# Patient Record
Sex: Female | Born: 1975 | Race: White | Hispanic: No | Smoking: Current every day smoker
Health system: Southern US, Academic
[De-identification: ages and names within clinical notes are randomized; demographics above are authoritative.]

## PROBLEM LIST (undated history)

## (undated) DIAGNOSIS — N979 Female infertility, unspecified: Secondary | ICD-10-CM

## (undated) DIAGNOSIS — B1920 Unspecified viral hepatitis C without hepatic coma: Secondary | ICD-10-CM

## (undated) DIAGNOSIS — G43909 Migraine, unspecified, not intractable, without status migrainosus: Secondary | ICD-10-CM

## (undated) DIAGNOSIS — N200 Calculus of kidney: Secondary | ICD-10-CM

## (undated) HISTORY — DX: Calculus of kidney: N20.0

## (undated) HISTORY — PX: HX GALL BLADDER SURGERY/CHOLE: SHX55

## (undated) HISTORY — PX: HX APPENDECTOMY: SHX54

## (undated) HISTORY — PX: HX WISDOM TEETH EXTRACTION: SHX21

---

## 2002-11-26 ENCOUNTER — Emergency Department (HOSPITAL_COMMUNITY): Payer: Self-pay | Admitting: Emergency Medicine

## 2007-12-08 ENCOUNTER — Encounter (FREE_STANDING_LABORATORY_FACILITY): Admit: 2007-12-08 | Discharge: 2007-12-08 | Disposition: A | Discharge status: Home / Self Care | Payer: Self-pay

## 2007-12-08 ENCOUNTER — Encounter (FREE_STANDING_LABORATORY_FACILITY): Admit: 2007-12-08 | Discharge: 2007-12-08 | Disposition: A | Discharge status: Still In Hospital | Payer: Self-pay

## 2007-12-09 LAB — LIPASE: LIPASE: 26 U/L (ref 6–51)

## 2007-12-09 LAB — CBC/DIFF
BASOPHILS: 1 % (ref 0–1)
BASOS ABS: 0.076 THOU/uL (ref 0.0–0.2)
EOS ABS: 0.146 10*3/uL (ref 0.1–0.3)
EOSINOPHIL: 2 % (ref 1–6)
HCT: 35.9 % (ref 33.5–45.2)
HGB: 12.2 g/dL (ref 11.2–15.2)
LYMPHOCYTES: 22 % (ref 20–45)
LYMPHS ABS: 1.74 THOU/uL (ref 1.0–4.8)
MCH: 32 pg (ref 27.4–33.0)
MCHC: 33.9 g/dL (ref 31.6–35.5)
MCV: 94.3 fL (ref 78–100)
MONOCYTES: 5 % (ref 4–13)
MONOS ABS: 0.393 THOU/uL (ref 0.1–0.9)
MPV: 8.8 FL (ref 7.4–10.4)
PLATELET COUNT: 303 THO/UL (ref 140–450)
PMN ABS: 5.59 10*3/uL (ref 1.3–7.7)
PMN'S: 70 % (ref 40–75)
RBC: 3.81 MIL/uL — ABNORMAL LOW (ref 3.84–5.04)
RDW: 11.1 % — ABNORMAL LOW (ref 11.5–14.5)
WBC: 8 10*3/uL (ref 3.5–11.0)

## 2007-12-09 LAB — COMPREHENSIVE METABOLIC PANEL, NON-FASTING
ANION GAP: 7 mmol/L (ref 5–16)
BUN: 8 mg/dL (ref 6–20)
CALCIUM: 9.4 mg/dL (ref 8.5–10.4)
CARBON DIOXIDE: 27 mmol/L (ref 22–32)
CHLORIDE: 103 mmol/L (ref 96–111)
CREATININE: 0.62 mg/dL (ref 0.49–1.10)
ESTIMATED GLOMERULAR FILTRATION RATE: >59 ml/min/1.73m2 (ref >59)
GLUCOSE,NONFAST: 114 mg/dL (ref 65–139)
POTASSIUM: 3.2 mmol/L — ABNORMAL LOW (ref 3.5–5.1)
SODIUM: 137 mmol/L (ref 136–145)

## 2007-12-09 LAB — AMYLASE: AMYLASE: 42 U/L (ref <128)

## 2014-04-14 ENCOUNTER — Encounter (HOSPITAL_COMMUNITY)
Admission: EM | Disposition: A | Discharge status: Home / Self Care | Payer: Self-pay | Source: Home / Self Care | Attending: Surgery

## 2014-04-14 ENCOUNTER — Encounter (HOSPITAL_COMMUNITY): Payer: MEDICAID | Admitting: Anesthesiology

## 2014-04-14 ENCOUNTER — Emergency Department (EMERGENCY_DEPARTMENT_HOSPITAL): Payer: MEDICAID

## 2014-04-14 ENCOUNTER — Other Ambulatory Visit (HOSPITAL_COMMUNITY): Payer: Self-pay | Admitting: Surgery

## 2014-04-14 ENCOUNTER — Emergency Department (HOSPITAL_COMMUNITY): Payer: MEDICAID | Admitting: Radiology

## 2014-04-14 ENCOUNTER — Encounter (HOSPITAL_COMMUNITY): Payer: Self-pay

## 2014-04-14 ENCOUNTER — Inpatient Hospital Stay
Admission: EM | Admit: 2014-04-14 | Discharge: 2014-04-14 | DRG: 342 | Disposition: A | Discharge status: Home / Self Care | Payer: MEDICAID | Attending: Surgery | Admitting: Surgery

## 2014-04-14 ENCOUNTER — Inpatient Hospital Stay (HOSPITAL_BASED_OUTPATIENT_CLINIC_OR_DEPARTMENT_OTHER): Payer: MEDICAID | Admitting: Anesthesiology

## 2014-04-14 ENCOUNTER — Inpatient Hospital Stay (HOSPITAL_COMMUNITY): Payer: MEDICAID | Admitting: Surgery

## 2014-04-14 DIAGNOSIS — A419 Sepsis, unspecified organism: Secondary | ICD-10-CM

## 2014-04-14 DIAGNOSIS — R1031 Right lower quadrant pain: Secondary | ICD-10-CM

## 2014-04-14 DIAGNOSIS — R1110 Vomiting, unspecified: Secondary | ICD-10-CM

## 2014-04-14 DIAGNOSIS — K3589 Other acute appendicitis: Secondary | ICD-10-CM

## 2014-04-14 DIAGNOSIS — F17210 Nicotine dependence, cigarettes, uncomplicated: Secondary | ICD-10-CM | POA: Diagnosis present

## 2014-04-14 DIAGNOSIS — K3580 Unspecified acute appendicitis: Principal | ICD-10-CM

## 2014-04-14 DIAGNOSIS — R6510 Systemic inflammatory response syndrome (SIRS) of non-infectious origin without acute organ dysfunction: Secondary | ICD-10-CM | POA: Diagnosis present

## 2014-04-14 DIAGNOSIS — R109 Unspecified abdominal pain: Secondary | ICD-10-CM

## 2014-04-14 DIAGNOSIS — K37 Unspecified appendicitis: Secondary | ICD-10-CM

## 2014-04-14 HISTORY — DX: Migraine, unspecified, not intractable, without status migrainosus: G43.909

## 2014-04-14 LAB — URINALYSIS MACROSCOPIC WITH REFLEX TO MICROSCOPIC URINALYSIS (CULTURE NOT PERFORMED)
BILIRUBIN: NEGATIVE
COLOR: NORMAL
GLUCOSE: NEGATIVE mg/dL
KETONES: NEGATIVE mg/dL
NITRITE: NEGATIVE
PH URINE: 5 (ref 5.0–8.0)
PROTEIN: NEGATIVE mg/dL
SPECIFIC GRAVITY, URINE: 1.023 (ref 1.005–1.030)
UROBILINOGEN: NEGATIVE mg/dL

## 2014-04-14 LAB — ALT (SGPT): ALT (SGPT): 6 U/L (ref <55)

## 2014-04-14 LAB — TYPE AND SCREEN
ABO/RH(D): B POS
ANTIBODY SCREEN: NEGATIVE

## 2014-04-14 LAB — BASIC METABOLIC PANEL
ANION GAP: 11 mmol/L (ref 4–13)
BUN/CREAT RATIO: 10 (ref 6–22)
BUN: 8 mg/dL (ref 8–25)
CALCIUM: 9.7 mg/dL (ref 8.5–10.4)
CARBON DIOXIDE: 25 mmol/L (ref 22–32)
CHLORIDE: 103 mmol/L (ref 96–111)
CREATININE: 0.77 mg/dL (ref 0.49–1.10)
ESTIMATED GLOMERULAR FILTRATION RATE: >59 mL/min/{1.73_m2} (ref >59)
GLUCOSE,NONFAST: 124 mg/dL (ref 65–139)
POTASSIUM: 3.5 mmol/L (ref 3.5–5.1)
SODIUM: 139 mmol/L (ref 136–145)

## 2014-04-14 LAB — VENOUS BLOOD GAS/LACTATE
%FIO2: 21 %
BASE DEFICIT: 0.2 mmol/L (ref 0.0–2.0)
BICARBONATE: 24 mmol/L (ref 22.0–26.0)
LACTATE: 1.1 mmol/L (ref 0.9–1.7)
PCO2: 44 mm Hg (ref 41.0–51.0)
PH: 7.37 (ref 7.310–7.410)
PO2: 38 mm Hg (ref 35–50)

## 2014-04-14 LAB — CBC/DIFF
BASOPHILS: 1 %
BASOS ABS: 0.105 10*3/uL (ref 0.000–0.200)
EOS ABS: 0.095 10*3/uL (ref 0.000–0.500)
EOSINOPHIL: 1 %
HCT: 37.7 % (ref 33.5–45.2)
HGB: 12.7 g/dL (ref 11.2–15.2)
LYMPHOCYTES: 7 %
LYMPHS ABS: 1.303 10*3/uL (ref 1.000–4.800)
MCH: 31 pg (ref 27.4–33.0)
MCHC: 33.7 g/dL (ref 32.5–35.8)
MCV: 92 fL (ref 78–100)
MONOCYTES: 4 %
MONOS ABS: 0.781 10*3/uL (ref 0.300–1.000)
MPV: 10 fL (ref 7.5–11.5)
PLATELET COUNT: 308 10*3/uL (ref 140–450)
PMN ABS: 17.498 10*3/uL — ABNORMAL HIGH (ref 1.500–7.700)
PMN'S: 87 %
RBC: 4.1 MIL/uL (ref 3.63–4.92)
RDW: 12.1 % (ref 12.0–15.0)
WBC: 19.8 10*3/uL — ABNORMAL HIGH (ref 3.5–11.0)

## 2014-04-14 LAB — URINALYSIS, MICROSCOPIC
RBC'S: 7 /HPF — ABNORMAL HIGH (ref <6)
WBC'S: 2 /HPF (ref <11)

## 2014-04-14 LAB — GAMMA GT: GAMMA GT: 18 U/L (ref 7–50)

## 2014-04-14 LAB — BILIRUBIN, CONJUGATED (DIRECT): BILIRUBIN,CONJUGATED: <0.2 mg/dL (ref <0.3)

## 2014-04-14 LAB — ALK PHOS (ALKALINE PHOSPHATASE): ALKALINE PHOSPHATASE: 89 U/L (ref <150)

## 2014-04-14 LAB — LIPASE: LIPASE: 19 U/L (ref 10–80)

## 2014-04-14 LAB — HCG, SERUM QUALITATIVE, PREGNANCY: PREGNANCY, SERUM QUALITATIVE: NEGATIVE

## 2014-04-14 LAB — AST (SGOT): AST (SGOT): 13 U/L (ref 8–41)

## 2014-04-14 SURGERY — APPENDECTOMY LAPAROSCOPIC
Anesthesia: General | Site: Abdomen | Wound class: Clean Contaminated Wounds-The respiratory, GI, Genital, or urinary

## 2014-04-14 MED ORDER — SUCCINYLCHOLINE CHLORIDE 200 MG/10 ML (20 MG/ML) INTRAVENOUS SYRINGE
INJECTION | Freq: Once | INTRAVENOUS | Status: DC | PRN
Start: 2014-04-14 — End: 2014-04-14
  Administered 2014-04-14: 100 mg via INTRAVENOUS

## 2014-04-14 MED ORDER — DEXAMETHASONE SODIUM PHOSPHATE 4 MG/ML INJECTION SOLUTION
Freq: Once | INTRAMUSCULAR | Status: DC | PRN
Start: 2014-04-14 — End: 2014-04-14
  Administered 2014-04-14: 4 mg via INTRAVENOUS

## 2014-04-14 MED ORDER — PROPOFOL 10 MG/ML IV BOLUS
INJECTION | Freq: Once | INTRAVENOUS | Status: DC | PRN
Start: 2014-04-14 — End: 2014-04-14
  Administered 2014-04-14: 200 mg via INTRAVENOUS
  Administered 2014-04-14: 5 mg via INTRAVENOUS

## 2014-04-14 MED ORDER — OXYCODONE-ACETAMINOPHEN 5 MG-325 MG TABLET
1.00 | ORAL_TABLET | ORAL | Status: DC | PRN
Start: 2014-04-14 — End: 2014-05-02

## 2014-04-14 MED ORDER — PHENYLEPHRINE 0.5 MG/5 ML (100 MCG/ML)IN 0.9 % SOD.CHLORIDE IV SYRINGE
INJECTION | Freq: Once | INTRAVENOUS | Status: DC | PRN
Start: 2014-04-14 — End: 2014-04-14
  Administered 2014-04-14: 150 ug via INTRAVENOUS

## 2014-04-14 MED ORDER — IOPAMIDOL 300 MG IODINE/ML (61 %) INTRAVENOUS SOLUTION
100.00 mL | INTRAVENOUS | Status: AC
Start: 2014-04-14 — End: 2014-04-14
  Administered 2014-04-14: 100 mL via INTRAVENOUS

## 2014-04-14 MED ORDER — SODIUM CHLORIDE 0.9 % IV BOLUS
1000.00 mL | INJECTION | Status: AC
Start: 2014-04-14 — End: 2014-04-14
  Administered 2014-04-14: 1000 mL via INTRAVENOUS
  Administered 2014-04-14: 0 mL via INTRAVENOUS

## 2014-04-14 MED ORDER — ROCURONIUM 50 MG/5 ML (10 MG/ML) INTRAVENOUS SYRINGE
INJECTION | Freq: Once | INTRAVENOUS | Status: DC | PRN
Start: 2014-04-14 — End: 2014-04-14
  Administered 2014-04-14: 35 mg via INTRAVENOUS

## 2014-04-14 MED ORDER — LACTATED RINGERS INTRAVENOUS SOLUTION
INTRAVENOUS | Status: DC
Start: 2014-04-14 — End: 2014-04-14

## 2014-04-14 MED ORDER — NEOSTIGMINE METHYLSULFATE 1 MG/ML INTRAVENOUS SOLUTION
Freq: Once | INTRAVENOUS | Status: DC | PRN
Start: 2014-04-14 — End: 2014-04-14
  Administered 2014-04-14: 3 mg via INTRAVENOUS

## 2014-04-14 MED ORDER — ENOXAPARIN 40 MG/0.4 ML SUBCUTANEOUS SYRINGE
40.0000 mg | INJECTION | SUBCUTANEOUS | Status: DC
Start: 2014-04-15 — End: 2014-04-14

## 2014-04-14 MED ORDER — FENTANYL (PF) 50 MCG/ML INJECTION SOLUTION
INTRAMUSCULAR | Status: AC
Start: 2014-04-14 — End: 2014-04-14
  Administered 2014-04-14: 100 ug via INTRAVENOUS
  Filled 2014-04-14: qty 2

## 2014-04-14 MED ORDER — FENTANYL (PF) 50 MCG/ML INJECTION SOLUTION
Freq: Once | INTRAMUSCULAR | Status: DC | PRN
Start: 2014-04-14 — End: 2014-04-14
  Administered 2014-04-14 (×2): 50 ug via INTRAVENOUS

## 2014-04-14 MED ORDER — GLYCOPYRROLATE 0.2 MG/ML INJECTION SOLUTION
Freq: Once | INTRAMUSCULAR | Status: DC | PRN
Start: 2014-04-14 — End: 2014-04-14
  Administered 2014-04-14: 0.2 mg via INTRAVENOUS

## 2014-04-14 MED ORDER — PROMETHAZINE 25 MG/ML INJECTION SOLUTION
25.00 mg | INTRAMUSCULAR | Status: AC
Start: 2014-04-14 — End: 2014-04-14

## 2014-04-14 MED ORDER — SODIUM CHLORIDE 0.9 % INTRAVENOUS SOLUTION
12.50 mg | Freq: Once | INTRAVENOUS | Status: DC | PRN
Start: 2014-04-14 — End: 2014-04-14
  Administered 2014-04-14: 12.5 mg via INTRAVENOUS
  Filled 2014-04-14: qty 0.5

## 2014-04-14 MED ORDER — ONDANSETRON HCL (PF) 4 MG/2 ML INJECTION SOLUTION
4.00 mg | Freq: Once | INTRAMUSCULAR | Status: DC | PRN
Start: 2014-04-14 — End: 2014-04-14

## 2014-04-14 MED ORDER — ONDANSETRON HCL (PF) 4 MG/2 ML INJECTION SOLUTION
4.0000 mg | Freq: Four times a day (QID) | INTRAMUSCULAR | Status: DC | PRN
Start: 2014-04-14 — End: 2014-04-14

## 2014-04-14 MED ORDER — OXYCODONE-ACETAMINOPHEN 5 MG-325 MG TABLET
2.0000 | ORAL_TABLET | ORAL | Status: DC | PRN
Start: 2014-04-14 — End: 2014-04-14
  Administered 2014-04-14: 2 via ORAL
  Filled 2014-04-14: qty 2

## 2014-04-14 MED ORDER — PROMETHAZINE 25 MG/ML INJECTION SOLUTION
INTRAMUSCULAR | Status: AC
Start: 2014-04-14 — End: 2014-04-14
  Administered 2014-04-14: 25 mg via INTRAMUSCULAR
  Filled 2014-04-14: qty 1

## 2014-04-14 MED ORDER — MORPHINE 4 MG/ML INTRAVENOUS CARTRIDGE
4.0000 mg | CARTRIDGE | INTRAVENOUS | Status: DC | PRN
Start: 2014-04-14 — End: 2014-04-14

## 2014-04-14 MED ORDER — MIDAZOLAM 1 MG/ML INJECTION SOLUTION
Freq: Once | INTRAMUSCULAR | Status: DC | PRN
Start: 2014-04-14 — End: 2014-04-14
  Administered 2014-04-14: 2 mg via INTRAVENOUS

## 2014-04-14 MED ORDER — CEFOXITIN 1 GRAM INTRAVENOUS SOLUTION
2.00 g | INTRAVENOUS | Status: AC
Start: 2014-04-14 — End: 2014-04-14
  Administered 2014-04-14: 0 g via INTRAVENOUS
  Administered 2014-04-14: 2 g via INTRAVENOUS
  Filled 2014-04-14: qty 20

## 2014-04-14 MED ORDER — OXYCODONE-ACETAMINOPHEN 5 MG-325 MG TABLET
1.0000 | ORAL_TABLET | ORAL | Status: DC | PRN
Start: 2014-04-14 — End: 2014-04-14

## 2014-04-14 MED ORDER — FENTANYL (PF) 50 MCG/ML INJECTION SOLUTION
100.00 ug | INTRAMUSCULAR | Status: AC
Start: 2014-04-14 — End: 2014-04-14

## 2014-04-14 MED ORDER — HYDROMORPHONE 1 MG/ML INJECTION WRAPPER
0.40 mg | INJECTION | INTRAMUSCULAR | Status: DC | PRN
Start: 2014-04-14 — End: 2014-04-14
  Administered 2014-04-14 (×4): 0.4 mg via INTRAVENOUS
  Filled 2014-04-14 (×2): qty 1

## 2014-04-14 MED ORDER — HYDROMORPHONE 1 MG/ML INJECTION WRAPPER
0.20 mg | INJECTION | INTRAMUSCULAR | Status: DC | PRN
Start: 2014-04-14 — End: 2014-04-14

## 2014-04-14 MED ORDER — LIDOCAINE (PF) 100 MG/5 ML (2 %) INTRAVENOUS SYRINGE
INJECTION | Freq: Once | INTRAVENOUS | Status: DC | PRN
Start: 2014-04-14 — End: 2014-04-14
  Administered 2014-04-14: 50 mg via INTRAVENOUS

## 2014-04-14 MED ORDER — ONDANSETRON HCL (PF) 4 MG/2 ML INJECTION SOLUTION
4.00 mg | INTRAMUSCULAR | Status: AC
Start: 2014-04-14 — End: 2014-04-14

## 2014-04-14 MED ORDER — ONDANSETRON HCL (PF) 4 MG/2 ML INJECTION SOLUTION
Freq: Once | INTRAMUSCULAR | Status: DC | PRN
Start: 2014-04-14 — End: 2014-04-14
  Administered 2014-04-14: 4 mg via INTRAVENOUS

## 2014-04-14 MED ORDER — HYDROMORPHONE 1 MG/ML INJECTION WRAPPER
0.2000 mg | INJECTION | INTRAMUSCULAR | Status: DC | PRN
Start: 2014-04-14 — End: 2014-04-14
  Administered 2014-04-14 (×2): 0.5 mg via INTRAVENOUS

## 2014-04-14 MED ORDER — ONDANSETRON HCL (PF) 4 MG/2 ML INJECTION SOLUTION
INTRAMUSCULAR | Status: AC
Start: 2014-04-14 — End: 2014-04-14
  Administered 2014-04-14: 4 mg via INTRAVENOUS
  Filled 2014-04-14: qty 2

## 2014-04-14 MED ADMIN — linezolid in 5% dextrose in water 600 mg/300 mL intravenous piggyback: INTRAVENOUS | @ 12:00:00

## 2014-04-14 MED ADMIN — DEXAMETHASONE IVPB: INTRAVENOUS | @ 12:00:00

## 2014-04-14 MED ADMIN — lactated Ringers intravenous solution: INTRAVENOUS | @ 13:00:00 | NDC 00338011704

## 2014-04-14 MED ADMIN — Medication: INTRAVENOUS | @ 09:00:00

## 2014-04-14 SURGICAL SUPPLY — 60 items
APPL 70% ISPRP 2% CHG 26ML 13._2X13.2IN CHLRPRP PREP DEHP-FR (WOUND CARE/ENTEROSTOMAL SUPPLY) ×1
APPL 70% ISPRP 2% CHG 26ML CHLRPRP HI-LT ORNG PREP STRL LF  DISP CLR (WOUND CARE SUPPLY) ×1 IMPLANT
APPLIER E-CLP III SUP INTLK 33CM 5MM PSTL GRIP GLARE RST SAF INTLK HNDL 16 CLIP TI MED LRG INTERNAL (ENDOSCOPIC SUPPLIES) IMPLANT
APPLIER E-CLP III SUP INTLK 33_CM 5MM PSTL GRIP GLARE RST SAF (INSTRUMENTS ENDOMECHANICAL)
APPLIER E-CLP2 SUP INTLK 29CM 10MM PSTL GRIP GLARE RST SAF INTLK HNDL 20 ML CLIP TI INTERNAL CLIP (ENDOSCOPIC SUPPLIES) IMPLANT
APPLIER E-CLP2 SUP INTLK 29CM_10MM PSTL GRIP GLARE RST SAF (INSTRUMENTS ENDOMECHANICAL)
BANDAGE CURITY 3.75X2IN 1 7/8I NX1.25IN FLXB STRCH FBRC XL (WOUND CARE SUPPLY) ×1 IMPLANT
BANDAGE CURITY 3.75X2IN 1 7/8I NX1.25IN FLXB STRCH FBRC XL (WOUND CARE/ENTEROSTOMAL SUPPLY) ×1
BANDAGE CURITY 3X.75IN .75X7/8 IN FLXB STRCH FBRC RECT ADH TN (WOUND CARE SUPPLY) ×3 IMPLANT
BANDAGE CURITY 3X.75IN .75X7/8 IN FLXB STRCH FBRC RECT ADH TN (WOUND CARE/ENTEROSTOMAL SUPPLY) ×3
BANDAGE PATCH PLASTIC CURAD_11/2 X 11/2IN 00330 (WOUND CARE/ENTEROSTOMAL SUPPLY) ×3
BLANKET 3M BAIR HUG ADLT UPR B ODY 74X24IN PLMR 2 INCS ADH (MISCELLANEOUS PT CARE ITEMS) ×2 IMPLANT
CONV USE 338641 - PACK SURG LAPSCP NONST DISP LF (CUSTOM TRAYS & PACK) ×1 IMPLANT
CONV USE 402176 - GOWN SURG LRG L3 NONREINFORCE HKLP CLSR SET IN SLEEVE STRL LF  DISP BLU SIRUS SMS 43IN (DGOW) ×1
CONV USE ITEM 162110 - BANDAGE PATCH PLASTIC CURAD_11/2 X 11/2IN 00330 (WOUND CARE SUPPLY) ×3 IMPLANT
CONV USE ITEM 324829 - SLEEVE LAPSCP 12MM 100MM EPTH XCL UNIV STAB CANN RADOPQ TROCAR STRL LF  DISP (ENDOSCOPIC SUPPLIES) IMPLANT
COVER WAND RFD STRL 50EA/CS_01-0020 (EQUIPMENT MINOR)
COVER WND RF DETECT STRL CLR EQP (EQUIPMENT MINOR) IMPLANT
DEVICE ENDOS ENDO PNUT 5MM 45CM SWAB COTTON DISP (ENDOSCOPIC SUPPLIES) ×1
DEVICE ENDOS ENDO PNUT 5MM 45C_M SWAB COTTON DISP (INSTRUMENTS ENDOMECHANICAL) ×1
DISC USE 30200_ENDO GIA ROTIC 45 2.5 DLU_BX/6 030454 (ENDOSCOPIC SUPPLIES) ×1 IMPLANT
DISC USE 30200_ENDO GIA ROTIC 45 2.5 DLU_BX/6 030454 (INSTRUMENTS ENDOMECHANICAL) ×1
DONUT EXTREMITY CUSHIONING 31143137 (POSITIONING PRODUCTS) ×2 IMPLANT
DUPE USE ITEM 49603 - TROCAR BLUNTPORT W/FOAM GRIP_176626P 3EA/BX (ENDOSCOPIC SUPPLIES) ×1 IMPLANT
ENDO GIA RELOAD ROTIC 30X2.5 030451 6/BX (ENDOSCOPIC SUPPLIES) ×2 IMPLANT
ENDO GIA RELOAD ROTIC 30X2.5 030451 6/BX (INSTRUMENTS ENDOMECHANICAL) ×2
ENDO GIA ROTIC 45 3.5 DLU BX/6 030455 (ENDOSCOPIC SUPPLIES) ×1 IMPLANT
ENDO GIA ROTIC 45 3.5 DLU BX/6 030455 (INSTRUMENTS ENDOMECHANICAL) ×1
ENDO RELOAD ROTIC 30 X 3.5 030452 (ENDOSCOPIC SUPPLIES) ×1 IMPLANT
ENDO RELOAD ROTIC 30 X 3.5 030452 (INSTRUMENTS ENDOMECHANICAL) ×1
ENDOCATCH 10MM SPEC POUCH_173050G 6EA/BX (INSTRUMENTS ENDOMECHANICAL) ×1
GARMENT COMPRESS MED CALF CENTAURA NYL VASOGRAD LTWT BRTHBL SEQ FIL BLU 18- IN (ORTHOPEDICS (NOT IMPLANTS)) ×1 IMPLANT
GARMENT COMPRESS MED CALF CENT_AURA NYL VASOGRAD LTWT BRTHBL (ORTHOPEDICS (NOT IMPLANTS)) ×1
GOWN SURG LRG AAMI L3 NONREINF_ORCE SET IN SLEEVE HKLP CLSR (DGOW) ×1
GOWN SURG LRG L3 NONREINFORCE HKLP CLSR SET IN SLEEVE STRL LF  DISP BLU SIRUS SMS 43IN (DGOW) ×1 IMPLANT
IRR SUCT STRKFL2 TIP STRL LF  DISP (IRR) IMPLANT
IRR SUCT STRKFL2 TIP STRL LF_DISP (IRR)
KIT RM TURNOVER CLEANOP CSTM INFCT CONTROL (KITS & TRAYS (DISPOSABLE)) ×1
KIT RM TURNOVER CLEANOP CSTM I_NFCT CONTROL (KITS & TRAYS (DISPOSABLE)) ×1
KIT RM TURNOVER CLEANOP CUSTOM INFCT CONTROL (KITS & TRAYS (DISPOSABLE)) ×1 IMPLANT
MBO USE ITEM 130230 - DEVICE ENDOS ENDO PNUT 5MM 45CM SWAB COTTON DISP (ENDOSCOPIC SUPPLIES) ×1 IMPLANT
PACK LAPAROSCOPIC CUSTOM (CUSTOM TRAYS & PACK) ×1
PACK SURG LAPSCP NONST DISP LF (CUSTOM TRAYS & PACK) ×1
PAD ARMBOARD FOAM BLU_FP-ECARM (POSITIONING PRODUCTS) ×4
PAD ARMBRD BLU (POSITIONING PRODUCTS) ×2 IMPLANT
POUCH SPEC RETR 34.5CM 10MM E-CTCH GLD POLYUR ERG HNDL LONG CYL TUBE 29.5CM LAPSCP LF  DISP (ENDOSCOPIC SUPPLIES) ×1 IMPLANT
SLEEVE TROCAR 12-100MM_OPTIVIEW CB12LT 6EA/BX (INSTRUMENTS ENDOMECHANICAL)
SOL IV 0.9% NACL 500ML PLASTIC CONTAINR VIAFLEX LF (SOLUTIONS) IMPLANT
SOLUTION IV NS INJ 500CC_2B1323Q 24/CS (SOLUTIONS)
STAPLER ENDO GIA UNIV 12MM_EGIAUSTND 3EA/BX (INSTRUMENTS ENDOMECHANICAL)
STAPLER ENDO GIA UNIV XL_ULTRA BX/3 EGIAUXL (INSTRUMENTS ENDOMECHANICAL) ×1
STAPLER INTERNAL 16CMX4MM PVC STD UNIV TISS STRL LF  DISP EGIA ENDOS 12MM (ENDOSCOPIC SUPPLIES) IMPLANT
STAPLER INTERNAL 26CMX4MM TI XL UNIV X THKTIS ARTC LRG BCKT STRL LF  DISP EGIA ULTRA ENDOS 12MM (ENDOSCOPIC SUPPLIES) ×1 IMPLANT
SUTURE 0 CT1 PDS2 27IN VIOL MONOF ABS (SUTURE/WOUND CLOSURE) ×2 IMPLANT
SUTURE 0 CT1 PDS2 27IN VIOL MO_NOF ABS (SUTURE/WOUND CLOSURE) ×2
SUTURE 4-0 PS2 MONOCRYL MTPS 27IN UNDYED MONOF ABS (SUTURE/WOUND CLOSURE) ×2 IMPLANT
SUTURE 4-0 PS2 MONOCRYL MTPS 2_7IN UNDYED MONOF ABS (SUTURE/WOUND CLOSURE) ×2
TROCAR BLADELESS 5MM ONB5STF_ONB5STF 6EA/BX (TROC) ×2
TROCAR BLUNTPORT W/FOAM GRIP_176626P 3EA/BX (INSTRUMENTS ENDOMECHANICAL) ×1
TROCAR LAPSCP STD 100MM 5MM VERSAONE FIX CANN BLDLS DLPHN NOSE TIP OPTC STRL LF  DISP (TROC) ×2 IMPLANT

## 2014-04-14 NOTE — Anesthesia Preprocedure Evaluation (Signed)
Physical Exam:     Airway       Mallampati: II    TM distance: >3 FB    Mouth Opening: fair.  No Facial hair          Dental       Dentition intact             Pulmonary    Breath sounds clear to auscultation  (-) no rhonchi, no decreased breath sounds, no wheezes, no rales and no stridor     Cardiovascular    Rhythm: regular  Rate: Normal  (-) no friction rub, carotid bruit is not present, no peripheral edema and no murmur     Other findings            Anesthesia Plan:  Planned anesthesia type: general  ASA 2     Intravenous induction   Patient's NPO status is appropriate for Anesthesia.    Anesthetic plan and risks discussed with patient.        Use of blood products discussed with patient whom.     Plan discussed with CRNA.                  NPO today. Vomited earlier, no nausea now

## 2014-04-14 NOTE — ED Provider Notes (Signed)
Chief Complaint: abdominal pain      History of Present Illness  History provided by patient.     Chelsea GaleaJamie Marie Mccall is a 38 y.o. female reporting to the ED via POV presenting with a CC of abdominal pain and vomiting. Reports she started having peri-umbilical pain around midnight and then awoke a short time later with NBNB vomiting. States pain is now migrating to RLQ. Described as constant, sharp, no alleviating or aggravating factors, radiating from periumbilical region to RLQ. Has h/o laparoscopic cholecystectomy. No fevers, chills, diarrhea. Has associated nausea and vomiting. No urinary or vaginal complaints.       Review of Systems  Constitutional: No fever, chills or weakness.   Skin: No rash or diaphoresis.   HENT: No headaches or congestion.   Eyes: No vision changes.   Cardio: No chest pain, palpitations or leg swelling.   Respiratory: No cough, wheezing or SOB.   GI: + nausea, + vomiting, + abdominal pain.   GU: No urinary changes.   MSK: No joint or back pain.   Neuro: No seizures or LOC.   Psychiatric: No depression, SI or substance abuse.   All other systems reviewed and are negative or as noted in HPI.      History  Past Medical History:   Past Medical History   Diagnosis Date    Migraine          Current Medications:   Previous Medications    No medications on file     Past Surgical History:   Past Surgical History   Procedure Laterality Date    Hx gall bladder surgery/chole         Social History:   History     Social History    Marital Status: N/A     Spouse Name: N/A     Number of Children: N/A    Years of Education: N/A     Occupational History    Not on file.     Social History Main Topics    Smoking status: Current Every Day Smoker -- 1.00 packs/day     Types: Cigarettes    Smokeless tobacco: Not on file    Alcohol Use: No    Drug Use: No    Sexual Activity: Not on file     Other Topics Concern    Not on file     Social History Narrative    No narrative on file     Family History: No  family history on file.  Allergies:   Allergies   Allergen Reactions    Naproxen        Above history reviewed with patient. Changes are as documented.      Physical Exam      Filed Vitals:    04/14/14 0447   BP: 100/62   Pulse: 98   Temp: 36.6 C (97.9 F)   Resp: 20   SpO2: 99%     Constitutional: NAD. Oriented. Actively vomiting.   Head: Normocephalic and atraumatic.   Mouth/Throat: Oropharynx is clear and dry  Eyes: Conjugate gaze. PERRL. EOMI  Neck: Trachea midline. Neck supple.   Cardiovascular: RRR. No murmurs, rubs or gallops. Intact distal pulses.   Pulmonary/Chest: BS equal bilaterally. No respiratory distress. No wheezes, rales, or rhonchi. No chest wall tenderness.   Abdominal: BS+. Abdomen soft and non-distended. RLQ TTP with guarding. No rebound. No CVAT  Musculoskeletal: No edema, tenderness or deformity.   Skin: Warm and dry. No rash, erythema,  pallor or cyanosis.   Psychiatric: Behavior is normal.   Neurological: Alert and oriented x3.    Nursing notes reviewed.      Course and Medical Decision Making  Impression: Patient presenting with a chief complaint of abdominal pain, vomiting concerning for, but not limited to, appendicitis vs SBO vs Ureteral stone.    Plan: Will obtain the following labs/imaging and administer the following medications to alleviate symptoms:       All labs were reviewed. Medical Records reviewed.       Consultations/Updates  -Pt given pain and nausea control with some relief  -CT concerning for non-perforated appendicitis  -Surgery consulted.   -Given Cefoxitin per recs of Surgery team  -Pt care transferred to Dr. Sherrie MustacheFisher at the end of my shift. Awaiting inpatient transfer      Disposition  Admit    Evonnie DawesHaley Elizabeth Moore Rapp, MD 04/14/2014, 04:56  PGY 3  [place] [facility]

## 2014-04-14 NOTE — Progress Notes (Addendum)
[facility]  Daily Surgery Progress Note     Chelsea, Mccall, 38 y.o. female  Date of Admission:  04/14/2014  Date of service: 04/14/2014  Date of Birth:  [birth year]    Subjective: POD0 s/p lap appy. Pain not well controlled. Some nausea and anxiety.    Vital Signs:  Temp (24hrs) Max:37.1 C (84.6 F)      Systolic (96EXB), MWU:13 mmHg, Min:86 mmHg, KGM:010 mmHg    Diastolic (27OZD), GUY:40 mmHg, Min:42 mmHg, Max:73 mmHg    Temp  Avg: 36.7 C (98 F)  Min: 36.4 C (97.5 F)  Max: 37.1 C (98.8 F)  Pulse  Avg: 63.2  Min: 49  Max: 98  Resp  Avg: 17.1  Min: 12  Max: 23  SpO2  Avg: 99.9 %  Min: 99 %  Max: 100 %  MAP (Non-Invasive)  Avg: 67.1 mmHG  Min: 53 mmHG  Max: 77 mmHG  Pain Score (Numeric, Faces): 6    Today's Physical Exam:  Gen: mild distress  Chest: CTAB  CV: RRR  Abd: soft, non-distended, dressings c/d/i, appropriately TTP  Extremities: FROM, 2+ peripheral pulses  Neuro: aaox3      Labs:  Results for orders placed or performed during the hospital encounter of 04/14/14 (from the past 24 hour(s))   CBC/DIFF   Result Value Ref Range    WBC 19.8 (H) 3.5 - 11.0 THOU/uL    RBC 4.10 3.63 - 4.92 MIL/uL    HGB 12.7 11.2 - 15.2 g/dL    HCT 37.7 33.5 - 45.2 %    MCV 92.0 78 - 100 fL    MCH 31.0 27.4 - 33.0 pg    MCHC 33.7 32.5 - 35.8 g/dL    RDW 12.1 12.0 - 15.0 %    PLATELET COUNT 308 140 - 450 THOU/uL    MPV 10.0 7.5 - 11.5 fL    PMN'S 87 %    PMN ABS 17.498 (H) 1.500 - 7.700 THOU/uL    LYMPHOCYTES 7 %    LYMPHS ABS 1.303 1.000 - 4.800 THOU/uL    MONOCYTES 4 %    MONOS ABS 0.781 0.300 - 1.000 THOU/uL    EOSINOPHIL 1 %    EOS ABS 0.095 0.000 - 0.500 THOU/uL    BASOPHILS 1 %    BASOS ABS 0.105 0.000 - 0.200 THOU/uL   BASIC METABOLIC PANEL, NON-FASTING   Result Value Ref Range    SODIUM 139 136 - 145 mmol/L    POTASSIUM 3.5 3.5 - 5.1 mmol/L    CHLORIDE 103 96 - 111 mmol/L    CARBON DIOXIDE 25 22 - 32 mmol/L    ANION GAP 11 4 - 13 mmol/L    CREATININE 0.77 0.49 - 1.10 mg/dL    ESTIMATED GLOMERULAR  FILTRATION RATE >59 >59 ml/min/1.39m    GLUCOSE,NONFAST 124 65 - 139 mg/dL    BUN 8 8 - 25 mg/dL    BUN/CREAT RATIO 10 6 - 22    CALCIUM 9.7 8.5 - 10.4 mg/dL   ALK PHOS (ALKALINE PHOSPHATASE)   Result Value Ref Range    ALKALINE PHOSPHATASE 89 <150 U/L   ALT (SGPT)   Result Value Ref Range    ALT (SGPT) 6 <55 U/L   AST (SGOT)   Result Value Ref Range    AST (SGOT) 13 8 - 41 U/L   BILIRUBIN, CONJUGATED (DIRECT)   Result Value Ref Range    BILIRUBIN,CONJUGATED <0.2 <0.3 mg/dL   GAMMA GT  Result Value Ref Range    GAMMA GT 18 7 - 50 U/L   LIPASE   Result Value Ref Range    LIPASE 19 10 - 80 U/L   URINALYSIS WITH CULTURE REFLEX   Result Value Ref Range    APPEARANCE CLOUDY (A) CLEAR    COLOR NORMAL NORMAL    SPECIFIC GRAVITY, URINE 1.023 1.005 - 1.030    GLUCOSE NEGATIVE NEGATIVE mg/dL    BILIRUBIN NEGATIVE NEGATIVE    KETONES NEGATIVE NEGATIVE mg/dL    BLOOD MODERATE (A) NEGATIVE    PH URINE 5.0 5.0 - 8.0    PROTEIN NEGATIVE NEGATIVE mg/dL    UROBILINOGEN NEGATIVE NEGATIVE mg/dL    NITRITE NEGATIVE NEGATIVE    LEUKOCYTES SMALL (A) NEGATIVE   URINALYSIS, MICROSCOPIC   Result Value Ref Range    RBC'S 7 (H) <6 /HPF    WBC'S 2 <11 /HPF    BACTERIA OCCASIONAL OR LESS OCL^OCCASIONAL OR LESS /hpf    SQUAMOUS EPITHELIAL FEW (A) OCCASIONAL OR LESS /LPF    MUCOUS LIGHT LIGHT /LPF   HCG, SERUM QUALITATIVE, PREGNANCY   Result Value Ref Range    PREGNANCY, SERUM QUALITATIVE NEGATIVE NEGATIVE   VENOUS BLOOD GAS/LACTATE   Result Value Ref Range    %FIO2 21 %    PH 7.370 7.310 - 7.410    PCO2 44.0 41.0 - 51.0 mm Hg    PO2 38 35 - 50 mm Hg    BICARBONATE 24.0 22.0 - 26.0 mmol/L    BASE EXCESS Test Not Performed 0.0 - 2.0 mmol/L    BASE DEFICIT 0.2 0.0 - 2.0 mmol/L    Specimen Type VENOUS     LACTATE 1.1 0.9 - 1.7 mmol/L   TYPE AND SCREEN   Result Value Ref Range    UNITS ORDERED NOT STATED         ABO/RH(D) B POSITIVE     ANTIBODY SCREEN NEGATIVE     SPECIMEN EXPIRATION DATE 04/17/2014          Assessment/ Plan:  Chelsea Mccall is  a 38 y.o., White female who presented on 04/14/2014 with Appendicitis [K37] who is now POD0 s/p lap appy.  Active Hospital Problems    Diagnosis    Appendicitis       Regular diet  PO percocet for pain, IV morphine for breakthrough  AAT    Dispo: possible discharge later today if pain is under control    Melynda Ripple II, MD  04/14/2014, 15:28      I saw and examined the patient.  I reviewed the resident's note.  I agree with the findings and plan of care as documented in the resident's note.  Any exceptions/additions are edited/noted.    Dorena Bodo, MD, FACS  Associate Professor of [place]    Janora Norlander, MD 04/15/2014, 07:52

## 2014-04-14 NOTE — Nurses Notes (Signed)
Patient discharged to home via ambulation with husband.  Discharge instructions reviewed with patient verbally and in writing.  Follow up x2 weeks.  May shower, no tubs, baths until 3 weeks.  No driving while taking pain meds.  Script given to patient.  PIV removed, catheter intact.  No further needs.

## 2014-04-14 NOTE — OR Surgeon (Addendum)
[facility]  DEPARTMENT OF SURGERY  OPERATION SUMMARY    PATIENT NAME: Chelsea Mccall  HOSPITAL ZOXWRU:[id number]:[id number]  DATE OF SERVICE: 04/14/2014  DATE OF BIRTH: 06/17/1976    PREOPERATIVE DIAGNOSIS:  Acute appendicitis.    POSTOPERATIVE DIAGNOSIS:  Acute appendicitis.    NAME OF PROCEDURE:  Laparoscopic appendectomy.    SURGEONS:  Gala Lewandowskyavid Borgstromt, MD (attending), Mellody LifeNathan Airhart MD (resident).    INDICATIONS FOR PROCEDURE:    Chelsea Mccall is a 38 y.o. female who presented to the ED with periumbilical / RLQ pain associated with emesis and anorexia. A CT of the abdomen demonstrated evidence of a dilated appendix. She was therefore taken to the OR for a laparoscopic appendectomy.     ANESTHESIA TYPE: General endotracheal anesthesia    ESTIMATED BLOOD LOSS Minimal    COMPLICATIONS: None    WOUND CLASS: Clean Wound: Uninfected operative wounds in which no inflammation occurred    DESCRIPTION OF PROCEDURE:  The patient was brought into the operating room after obtaining informed consent and general anesthesia was induced. The abdomen was prepped and draped in a sterile fashion and an infraumbilical incision was made with an 11 blade and carried down to the base of the umbilicus. The base was grasped and retracted cephalad, an incision was made in the midline fascia. Stay sutures of 0-zPDS were placed on each side and blunt entry was made into the peritoneal cavity without incident. A Hasson trocar was introduced and CO2 was insufflated, creating a pneumoperitoneum to 15 mmHg. There was no evidence of any complication was seen as a result of gaining access to the patient's abdomen.     Attention was then turned to the patient's right lower quadrant where the appendix was identified as it was attached to the cecum.  A 5-mm trocar was placed in the left lower quadrant , followed by a 5mm trocar in the midline suprapubic region.  Once this was accomplished, the blunt dissectors were used to retract  the appendix and a window was made through the mesoappendix at the base of the appendix with a [place] grasper. Mild inflammation was present but no abscess or evidence of necrosis/perforation was visualized.  Once this window was obtained, a GIA stapler was used with a  blue load to transect the base of the appendix from its attachment to the cecum.  The appendix was then retracted superiorly and a GIA white load was used to transect the mesoappendix.  Both staple lines were inspected.  They were found to be hemostatic.  The GIA stapler was removed and an Endo Catch was inserted to collect the appendix.   The appendix was removed through the infraumbilical incision and the remaining 5mm trocars were removed.  The insufflation was released from the patient's abdomen.  The LLQ fascia was closed using the 0 PDS suture.  The fascial defect was closed by palpable inspection.  A 4-0 Monocryl was then used in a subcuticular fashion to close the patient's skin both at the periumbilical incision as well as the trocar sites.  Band Aids were applied.  At the end of the case, the instrument, scrub and needle counts were correct x2.     SPECIMENS: vermiform appendix    Mellody LifeNathan Airhart, MD  04/14/2014, 12:51      I was present for all key and/or critical portions of the case and immediately available at all times.    Vernie Ammonsavid  C Auron Tadros, MD, FACS  Associate Professor of Surgery  General Surgery/ Trauma/ Surgical Critical Care        Bethann Humbleavid Neshia Mckenzie, MD 04/19/2014, 15:09

## 2014-04-14 NOTE — ED Nurses Note (Signed)
Pt resting in bed at this time, no c/o noted, will continue to monitor.

## 2014-04-14 NOTE — ED Attending Handoff Note (Signed)
Note begun by: Pete Glatter, MD on 04/14/2014 at 7:07 AM  Sign-out received from Dr. Waylan Rocher  Pertinent history and physical:  72 F abd pain, nausea, vomiting,   Diagnostic Information:     -  CT:  Abnormally dilated appendix with a proximal appendicolith and subtle periappendiceal fat stranding compatible with appendicitis. No periappendiceal abscess, free fluid, or free air is identified in the abdomen or pelvis.   -  KUB:  Nonobstructed bowel gas pattern with a moderate amount of fecal material noted throughout the colon.  Labs Reviewed   CBC/DIFF - Abnormal; Notable for the following:     WBC 19.8 (*)     PMN ABS 17.498 (*)     All other components within normal limits   URINALYSIS WITH CULTURE REFLEX - Abnormal; Notable for the following:     APPEARANCE CLOUDY (*)     BLOOD MODERATE (*)     LEUKOCYTES SMALL (*)     All other components within normal limits   URINALYSIS, MICROSCOPIC - Abnormal; Notable for the following:     RBC'S 7 (*)     SQUAMOUS EPITHELIAL FEW (*)     All other components within normal limits   URINE CULTURE,ROUTINE   BASIC METABOLIC PANEL, NON-FASTING   ALK PHOS (ALKALINE PHOSPHATASE)   ALT (SGPT)   AST (SGOT)   BILIRUBIN, CONJUGATED (DIRECT)   GAMMA GT   LIPASE   HCG, SERUM QUALITATIVE, PREGNANCY   VENOUS BLOOD GAS/LACTATE     To Follow-up:  CT and KUB read, plan for admission    Update:    0750:  CT showed appendicitis.  Surgery called for admission.  Repeat BP 86/50.  2nd L IVF ordered.    Disposition: Admit    Clinical Impression:     Encounter Diagnoses   Name Primary?   . Appendicitis Yes   . SIRS (systemic inflammatory response syndrome)

## 2014-04-14 NOTE — ED Nurses Note (Signed)
Iv bolus begun blood gas sent to lab. Pt boyfriend eating in room is aware he cannot share anything

## 2014-04-14 NOTE — OR Surgeon (Addendum)
[facility]                                                     BRIEF OPERATIVE NOTE    Patient Name: Chelsea Mccall,[name]  Hospital Number: [id number]  Date of Service: 04/14/2014   Date of Birth: 06/10/1976    All elements must be documented.    Pre-Operative Diagnosis: Acute appendicitis   Post-Operative Diagnosis: Acute appendicitis  Procedure(s)/Description:  Laparoscopic Appendectomy  Findings/Complexity (inherent to the procedure performed): Normal appearing appendix, no evidence of rupture or fluid collection     Attending Surgeon: Dr. Jeralene HuffBorgstrom  Assistant(s): Mellody LifeNathan Airhart, PGY3    Anesthesia Type: General endotracheal anesthesia  Estimated Blood Loss:  Minimal  Blood Given: 0cc  Fluids Given: 500 cc crystalloid  Complications (unintended/unexpected/iatrogenic/accidental/inadvertent events):  None  Wound Class: Clean Wound: Uninfected operative wounds in which no inflammation occurred    Tubes: None  Drains: None  Specimens/ Cultures: appendix  Implants: none           Disposition: PACU - hemodynamically stable.  Condition: stable    Mellody LifeNathan Airhart, MD 04/14/2014, 12:49      I was present for all key and/or critical portions of the case and immediately available at all times.    Vernie Ammonsavid  C Shelton Soler, MD, FACS  Associate Professor of Surgery  General Surgery/ Trauma/ Surgical Critical Care        Bethann Humbleavid Luvern Mischke, MD 04/15/2014, 07:49

## 2014-04-14 NOTE — Anesthesia Postprocedure Evaluation (Signed)
ANESTHESIA POSTOP EVALUATION NOTE        Anesthesia Service      [facility]     04/14/2014     Last Vitals: Temperature: 36.5 C (97.7 F) (04/14/14 1241)  Heart Rate: 59 (04/14/14 1245)  BP (Non-Invasive): (!) 86/43 mmHg (04/14/14 1245)  Respiratory Rate: 14 (04/14/14 1245)  SpO2-1: 100 % (04/14/14 1245)  Pain Score (Numeric, Faces): 0 (04/14/14 1103)    Procedure(s):  APPENDECTOMY LAPAROSCOPIC    Patient is sufficiently recovered from the effects of anesthesia to participate in the evaluation and has returned to their pre-procedure level.  I have reviewed and evaluated the following:  Respiratory Function: Consistent with pre anesthetic level  Cardiovascular Function: Consistent with pre anesthetic level  Mental Status: Return to pre anesthetic baseline level  Pain: Sufficiently controlled with medication  Nausea and Vomiting: Absent or sufficiently controlled with medication  Post-op Anesthetic Complications: None    Comment/ re-evaluation for any variations: None

## 2014-04-14 NOTE — ED Nurses Note (Signed)
Pt resting in bed at this time, no c/o noted, family at bedside, will continue to monitor.

## 2014-04-14 NOTE — ED Nurses Note (Signed)
Pt transported to CT scanner and then Xray.

## 2014-04-14 NOTE — ED Nurses Note (Signed)
Pt resting in bed at this time, will continue to monitor.

## 2014-04-14 NOTE — ED Nurses Note (Signed)
Report called via voicecare, pt resting in bed at this time, no c/o noted, family at bedside, labs drawn and sent as per order, medicated as per order (see MAR), will continue to monitor.

## 2014-04-14 NOTE — ED Nurses Note (Signed)
Pt came to the ED with a C/O abdominal pain that started 04/13/14 on her side and now is midline.  Pt had a migraine also today.  Pt is vomiting and not tolerating PO.  Orders obtained, PIV placed, + blood return, blood obtained, and sent to the lab.  Medication given per order.

## 2014-04-14 NOTE — Discharge Summary (Addendum)
DISCHARGE SUMMARY      PATIENT NAME:  Chelsea Mccall,Chelsea Mccall  MRN:  [id number]  DOB:  02/07/1976    ADMISSION DATE:  04/14/2014  DISCHARGE DATE:  04/14/2014    ATTENDING PHYSICIAN: Bethann HumbleBorgstrom, Carlisle Enke, MD  PRIMARY CARE PHYSICIAN: No Established Pcp     DISCHARGE DIAGNOSIS:   Principle Problem: Appendicitis  Active Hospital Problems    Diagnosis Date Noted    Principle Problem: Appendicitis 04/14/2014      Resolved Hospital Problems    Diagnosis    No resolved problems to display.     There are no active non-hospital problems to display for this patient.     Allergies   Allergen Reactions    Naproxen      DISCHARGE MEDICATIONS:     Current Discharge Medication List      START taking these medications.       Details    oxyCODONE-acetaminophen 5-325 mg Tablet   Commonly known as:  PERCOCET    1 Tab, Oral, EVERY 4 HOURS PRN   Qty:  40 Tab   Refills:  0           DISCHARGE INSTRUCTIONS:        Follow-up Information     Follow up with General Surgery-POC .    Specialty:  General Surgery    Contact information:    [address]  [place] [id number]  [phone]          DISCHARGE INSTRUCTION - RESUME HOME DIET   Diet: RESUME HOME DIET      DISCHARGE INSTRUCTION - ACTIVITY   Activity: NO DRIVING WHILE TAKING NARCOTICS      DISCHARGE INSTRUCTION - MISC   You may shower. No baths or swimming for 3 weeks. Pat dry incisions. You may remove the bandaids over your incisions tomorrow. Notify your physician if you notice increased redness or foul smelling discharge from your incision sites. Notify your physician if you develop a fever above 101.5, prolonged nausea / vomiting, or are unable to eat or drink.     SCHEDULE FOLLOW-UP SURG SPEC - GENERAL - PHYSICIAN OFFICE CENTER   Follow-up in: 2 WEEKS    Reason for visit: HOSPITAL DISCHARGE    Followup reason: Status post lap appendectomy on 04/14/14    Provider: Bethann Humbleavid Tine Mabee         REASON FOR HOSPITALIZATION AND HOSPITAL COURSE:  This is a 38 y.o., female who presented to the  ED with periumbilical / RLQ abdominal pain and nausea. A CT demonstrated evidence of a dilated, inflamed appendix. She was taken to OR for laparoscopic appendectomy.  She tolerated the procedure well.  After awakening from anesthesia she was taken to PACU in good condition.  Postoperatively, her pain was controlled with PO medications (percocet). She tolerated a regular diet. She was discharged that evening in good condition.  Follow up, instructions and prescriptions given.  Incisions were clean/dry/intact on discharge.      CONDITION ON DISCHARGE:  A. Ambulation: Full ambulation  B. Self-care Ability: Complete  C. Cognitive Status Alert and Oriented x 3    Code Status/Treatment Limitations/CMO             FULL CODE   CONTINUOUS          FULL CODE   CONTINUOUS,   Status:  Canceled             DISCHARGE DISPOSITION:  Home discharge  Mellody LifeNathan Airhart, MD        Copies sent to Care Team       Relationship Specialty Notifications Start End    Pcp, No Established PCP - General   04/14/14           Referring providers can utilize [email or web address] to access their referred [facility] patient's information.    Vernie Ammonsavid  C Magali Bray, MD, FACS  Associate Professor of Surgery  General Surgery/ Trauma/ Surgical Critical Care        Bethann Humbleavid Abdullah Rizzi, MD 04/15/2014, 07:41

## 2014-04-14 NOTE — ED Resident Handoff Note (Signed)
Allergies   Allergen Reactions   . Naproxen        Filed Vitals:    04/14/14 0447 04/14/14 0722   BP: 100/62 86/50   Pulse: 98 64   Temp: 36.6 C (97.9 F)    Resp: 20 20   SpO2: 99% 100%       HPI:  Chelsea Mccall is a 38 y.o. female presents with RLQ vomiting. overnight    Pertinent Exam Findings:  RLQ pain    Pertinent Imaging/Lab results:  Labs Reviewed   CBC/DIFF - Abnormal; Notable for the following:     WBC 19.8 (*)     PMN ABS 17.498 (*)     All other components within normal limits   URINE CULTURE,ROUTINE   BASIC METABOLIC PANEL, NON-FASTING   ALK PHOS (ALKALINE PHOSPHATASE)   ALT (SGPT)   AST (SGOT)   BILIRUBIN, CONJUGATED (DIRECT)   GAMMA GT   LIPASE   HCG, SERUM QUALITATIVE, PREGNANCY   URINALYSIS WITH CULTURE REFLEX   URINALYSIS, MICROSCOPIC     Results for orders placed or performed during the hospital encounter of 04/14/14 (from the past 24 hour(s))   CT ABDOMEN PELVIS W IV CONTRAST     Status: None (Preliminary result)    Narrative    Chelsea Mccall  CT ABDOMEN PELVIS W IV CONTRAST performed on Apr 14, 2014  7:04 AM.    CLINICAL HISTORY: 38 y.o. female with RLQ pain. Concern for appendicitis.    INTRAVENOUS CONTRAST: 151m's of Isovue 300.    COMPARISON: None available    FINDINGS:  No focal infiltrate, pleural effusion, pulmonary edema, or pneumothorax is   identified in the included lung bases.    The liver and spleen are normal in size and enhance homogenously.  The   patient is status post cholecystectomy.  Scattered punctate calcifications   are noted throughout the spleen, possibly as a result of prior   granulomatous disease.  The pancreas is normal in size with no abnormal   dilation of the pancreatic duct or abnormal peripancreatic stranding.    There is no intra-or extrahepatic biliary ductal dilation.    There is no hydronephrosis, nephrolithiasis, or ureterolithiasis.  The   bladder is decompressed limiting evaluation for wall thickening.  The   uterus and ovaries are normal in  size.  An enhancing cyst is seen in the   left ovary, likely representing the corpus luteum.      The appendix is abnormally large in diameter with a tiny appendicolith   noted at its proximal aspect.  There is a small amount of periappendiceal   fat stranding.  There is no evidence of periappendiceal abscess.  No free   air or free fluid is identified in the abdomen or pelvis.  A moderate   amount of fecal material is seen throughout the colon.  There is no   evidence of bowel obstruction.  The gastric lumen is decompressed limiting   evaluation for wall thickening.    The hepatic and portal veins are patent.  No acute bony injury is   identified.  Vertebral body heights and spinal alignment are preserved.      Impression    Abnormally dilated appendix with a proximal appendicolith and subtle   periappendiceal fat stranding compatible with appendicitis.  No   periappendiceal abscess, free fluid, or free air is identified in the   abdomen or pelvis.  This was discussed with Dr. FCaryn Sectionof the emergency department  by Eulis Manly, DO via telephone conversation at 7:16 AM on April 14, 2014.      XR ABD FLAT AND UPRIGHT SERIES     Status: None (Preliminary result)    Narrative    CLINICAL HISTORY: 38 y.o. female with vomiting, abd pain.    TECHNIQUE: XR ABD FLAT AND UPRIGHT SERIES PA view of the chest with   frontal upright and supine views of the abdomen with a total of 4 images   performed on Apr 14, 2014  7:16 AM.    COMPARISON: None available    FINDINGS: No focal infiltrate, pleural effusion, pulmonary edema, or   pneumothorax is identified in the lungs.  The cardiomediastinal silhouette   is normal in size.  No acute bony injury is identified.    Postoperative clips project over the right upper quadrant of the abdomen   likely from previous cholecystectomy.  No free air is identified on   upright views.  Contrast is noted in the bilateral renal collecting   systems and ureters as well as the bladder, which is  decompressed.  A   moderate amount of fecal material is noted throughout the colon.  No   abnormally dilated loops of small bowel are detected.      Impression     Nonobstructed bowel gas pattern with a moderate amount of   fecal material noted throughout the colon.            Pending Studies:  none    Consults:  surgery    Plan:  Admit to surgery  After a thorough discussion of the patient including presentation, ED course, and review of above information I have assumed care of Chelsea Mccall from Dr. Bunnie Philips at 07:41      Shelly Flatten, MD 04/14/2014, 07:41    Course:  CT abdomen and pelvis returned with acute appendicitis. Plantar markable for elevated white blood cell count. General surgery was consult for appendicitis. Mefoxin given upon discovery of appendicitis. General surgery evaluated and admitted patient. Remained stable without complications until transfer to the floor.  Shelly Flatten, MD  04/14/2014, 19:15

## 2014-04-14 NOTE — Anesthesia Transfer of Care (Cosign Needed)
ANESTHESIA TRANSFER OF CARE NOTE        Anesthesia Service      [facility]         Last Vitals: Temperature: 37.1 C (98.8 F) (04/14/14 1108)  Heart Rate: 62 (04/14/14 1108)  BP (Non-Invasive): (!) 107/57 mmHg (04/14/14 1108)  Respiratory Rate: 18 (04/14/14 1108)  SpO2-1: 100 % (04/14/14 1108)  Pain Score (Numeric, Faces): 0 (04/14/14 1103)    Patient transferred to PACU in stable condition. Report given to RN.    10/10/2015at 12:47.

## 2014-04-14 NOTE — H&P (Addendum)
[facility]  Surgery  Admission H&P    Chelsea GaleaDavis,Kristol Marie, 38 y.o. female  Date of Birth:  08/26/1975  Date of Admission:  04/14/2014    Information Obtained from: patient  Chief Complaint: abdominal pain    PCP: No Established Pcp     HPI: (must include no less than 4 of the following main descriptors) Location (of pain): Quality (character of pain) Severity (minimal, mild, severe, scale or 1-10) Duration (how long has pain/sx present) Timing (when does pain/sx occur)  Context (activity at/before onset) Modifying Factors (what makes pain/sx  Better/worse) Associate Sign/Sx (what accompanies main pain/sx)     Chelsea Mccall is a 38 y.o., White female who presents with centriumbilical abdominal pain of less than 24 hours duration. Pain was moderate to severe in intensity. Multiple associated episodes of emesis. No fevers/chills. Last BM was today and regular. Pain relieved by narcotics. Anorexia resolving.       Pre-operative Risk Assessment   Baseline Dyspnea: None  Functional Health Status Prior to Surgery(Best in last 30 days): Independent - No assistance required for activities of daily living (May Use prosthetics or DME)  COPD: Patient does not have COPD  Ascites: No  CHF within 30 days: No  Hypertension requiring medications: No  Renal failure requiring dialysis: No  Widely metastatic cancer (Stage 4): No  Open wound: No  Weight loss > 10 % body weight in last 6 months: No  Bleeding disorder: No  Urinary Symptoms None  Current UTI Treatment:No  Pneumonia Symptoms:  none  Current Pneumonia Treatment:  no    ROS:  MUST comment on all "Abnormal" findings   ROS Other than ROS in the HPI, all other systems were negative.    PAST MEDICAL/ FAMILY/ SOCIAL HISTORY:       Past Medical History   Diagnosis Date    Migraine      No past medical history pertinent negatives.          Allergies   Allergen Reactions    Naproxen      Medications Prior to Admission     None         Past Surgical History      Procedure Laterality Date    Hx gall bladder surgery/chole       No past surgical history pertinent negatives.      No family history on file.      History   Substance Use Topics    Smoking status: Current Every Day Smoker -- 1.00 packs/day     Types: Cigarettes    Smokeless tobacco: Not on file    Alcohol Use: No         PHYSICAL EXAMINATION: MUST comment on all "Abnormal" findings    Exam Temperature: 36.6 C (97.9 F)  Heart Rate: 64  BP (Non-Invasive): (!) 86/50 mmHg  Respiratory Rate: 20  SpO2-1: 100 %  Pain Score (Numeric, Faces): 8  General: no distress  Eyes: Conjunctiva clear.  HENT:Head atraumatic and normocephalic  Neck: No JVD  Lungs: Clear to auscultation bilaterally.   Cardiovascular: regular rate and rhythm  Abdomen: soft, RLQ TTP, no rebound/guarding, +obturator, negative rovsings  Extremities: No cyanosis or edema  Skin: Skin warm and dry  Neurologic: Grossly normal  Lymphatics: No lymphadenopathy  Psychiatric: Normal    Labs Ordered/ Reviewed (Please indicate ordered or reviewed)   Reviewed: Labs:  I have reviewed all lab results.  CBC Results Differential Results   Recent Labs  04/14/14   0500   WBC  19.8*   HGB  12.7   HCT  37.7   PLTCNT  308    Recent Results (from the past 30 hour(s))   CBC/DIFF    Collection Time: 04/14/14  5:00 AM   Result Value    WBC 19.8 (H)    PMN'S 87    LYMPHOCYTES 7    MONOCYTES 4    EOSINOPHIL 1    BASOPHILS 1        Ordered: none    Radiology Tests Ordered/ Reviewed (Please indicate ordered or reviewed)   Reviewed:CT:   Direct visualization of the image on 04/14/2014 on [facility] PACS showed:  Acutely inflamed and dilated appendix    Ordered: none      ASSESSMENT & PLAN:      Active Hospital Problems   (*Primary Problem)    Diagnosis    Appendicitis     NPO  IV Fluids  Invanz  Will consent for Lap Appy today    Lamonte SakaiMoungar E Cooper, MD, 04/14/2014 08:08        I saw and examined the patient.  I reviewed the resident's note.  I agree with the findings and plan of  care as documented in the resident's note.  Any exceptions/additions are edited/noted.    Vernie Ammonsavid  C Amali Uhls, MD, FACS  Associate Professor of Surgery  General Surgery/ Trauma/ Surgical Critical Care    Bethann Humbleavid Edie Darley, MD 04/14/2014, 09:27

## 2014-04-14 NOTE — ED Attending Note (Signed)
Note begun by: Rushie Goltzwen M Omarie Parcell, MD on 04/14/2014 at 5:41 AM  I was physically present and directly supervised this patient's care.  Patient seen and examined.  Resident / Trixie DredgeMidlevel / NP history and exam reviewed.   Key elements in addition to and/or correction of that documentation are as follows:    HPI :    38 y.o. y.o. female presents with chief complaint of abdominal pain and vomiting, went to bed in USOH, awoke with pain and nauea, vomited, felt better, went back to sleep, awoke later with worsening intractable pain and vomiting.  No fevers.    PE :   VS on presentation: Temperature: 36.6 C (97.9 F)  Heart Rate: 98  Respiratory Rate: 20  BP (Non-Invasive): 100/62 mmHg  SpO2-1: 99 %  Oxygen Therapy  SpO2-1: 99 %  $ O2 Delivery: None (Room Air)    Moderate evident distress  Abdomen tender  guarding    Data/Test :    EKG : None  Images Personally Reviewed : CT with appy inflammation  Image Reports Reviewed:    Labs:  Laboratory values obtained as documented in the patients chart and the resident note.  Notable lab values:      Review of Prior Data :       Prior Images : None  Prior EKG : None  Online Medical Records:  None  Transfer Docs/Images:  None    Initial Assessment:   Pain, vomiting, concerning exam    MDM/Plan:    IVF, analgesia, CT    ED Course:   Per Vanita Pandaapp, + for appy on CT, surgery called, [facility]      PROCEDURES:      Disposition: Admitted    Clinical Impression:     Encounter Diagnoses   Name Primary?    Appendicitis Yes    SIRS (systemic inflammatory response syndrome)        CRITICAL CARE : None    This note was completed after the conclusion of care given the need for direct patient care at the time of service.

## 2014-04-14 NOTE — Nurses Notes (Signed)
Pt cleared for discharge by md. VSS. NAD noted at this time. PIV removed. Reviewed AVS with patient and family. Reviewed and given prescriptions and medication handouts. Reviewed and given educational handouts. Patient and family verbalized understanding of all discharge teaching. Encouraged to ask questions throughout education. Discharge to home via piv with belongings.

## 2014-04-14 NOTE — Nurses Notes (Signed)
Pt received to room 767. Vss. NAD noted at this time. Will ctm.

## 2014-04-15 LAB — URINE CULTURE,ROUTINE: CULTURE OBSERVATION: 20000

## 2014-04-17 LAB — HISTORICAL SURGICAL PATHOLOGY SPECIMEN

## 2014-05-02 ENCOUNTER — Ambulatory Visit: Payer: MEDICAID | Attending: Surgery | Admitting: Surgery

## 2014-05-02 VITALS — BP 112/74 | Temp 97.9°F | Ht 63.0 in | Wt 117.9 lb

## 2014-05-02 DIAGNOSIS — K37 Unspecified appendicitis: Secondary | ICD-10-CM

## 2014-05-02 DIAGNOSIS — Z09 Encounter for follow-up examination after completed treatment for conditions other than malignant neoplasm: Secondary | ICD-10-CM | POA: Insufficient documentation

## 2014-05-02 NOTE — Progress Notes (Signed)
F/U after lap appendectomy for acute appendicitis.  She is doing well.  She is tolerating a regular diet.  She is not having any nausea, vomiting, diarrhea, abdominal pain, fever or chills.  No redness or drainage from the incisions.       The belly is flat, soft, and non-tender.  The incisions are healing well.  There is no erythema, bruising, or drainage.      Path report:  Acute appendicitis.    RTC prn.  She may resume her usual activities.  F/U with PCP for routine care.    Bethann Humbleavid Druscilla Petsch, MD 05/02/2014, 09:56

## 2014-09-06 ENCOUNTER — Encounter (HOSPITAL_COMMUNITY): Payer: Self-pay

## 2014-09-06 ENCOUNTER — Emergency Department (EMERGENCY_DEPARTMENT_HOSPITAL): Payer: Self-pay

## 2014-09-06 ENCOUNTER — Emergency Department
Admission: EM | Admit: 2014-09-06 | Discharge: 2014-09-06 | Disposition: A | Discharge status: Home / Self Care | Payer: Self-pay | Attending: Physician Assistant | Admitting: Physician Assistant

## 2014-09-06 ENCOUNTER — Emergency Department (HOSPITAL_COMMUNITY): Payer: Self-pay

## 2014-09-06 DIAGNOSIS — N200 Calculus of kidney: Secondary | ICD-10-CM | POA: Insufficient documentation

## 2014-09-06 DIAGNOSIS — R319 Hematuria, unspecified: Secondary | ICD-10-CM | POA: Insufficient documentation

## 2014-09-06 DIAGNOSIS — N841 Polyp of cervix uteri: Secondary | ICD-10-CM | POA: Insufficient documentation

## 2014-09-06 DIAGNOSIS — R109 Unspecified abdominal pain: Secondary | ICD-10-CM

## 2014-09-06 DIAGNOSIS — F17210 Nicotine dependence, cigarettes, uncomplicated: Secondary | ICD-10-CM | POA: Insufficient documentation

## 2014-09-06 DIAGNOSIS — Z87442 Personal history of urinary calculi: Secondary | ICD-10-CM | POA: Insufficient documentation

## 2014-09-06 DIAGNOSIS — N8320 Unspecified ovarian cysts: Secondary | ICD-10-CM | POA: Insufficient documentation

## 2014-09-06 LAB — CBC/DIFF
BASOPHILS: 1 %
BASOS ABS: 0.073 THOU/uL (ref 0.000–0.200)
EOS ABS: 0.023 THOU/uL (ref 0.000–0.500)
EOSINOPHIL: 0 %
HCT: 37.3 % (ref 33.5–45.2)
HGB: 12.5 g/dL (ref 11.2–15.2)
LYMPHOCYTES: 14 %
LYMPHS ABS: 1.12 THOU/uL (ref 1.000–4.800)
MCH: 31.1 pg (ref 27.4–33.0)
MCHC: 33.5 g/dL (ref 32.5–35.8)
MCV: 92.6 fL (ref 78–100)
MONOCYTES: 5 %
MONOS ABS: 0.424 THOU/uL (ref 0.300–1.000)
MPV: 9.4 fL (ref 7.5–11.5)
PLATELET COUNT: 284 THOU/uL (ref 140–450)
PMN ABS: 6.51 10*3/uL (ref 1.500–7.700)
PMN'S: 80 %
RBC: 4.02 MIL/uL (ref 3.63–4.92)
RBC: 4.02 MIL/uL (ref 3.63–4.92)
RDW: 12.5 % (ref 12.0–15.0)
WBC: 8.1 10*3/uL (ref 3.5–11.0)

## 2014-09-06 LAB — LIPASE: LIPASE: 19 U/L (ref 10–80)

## 2014-09-06 LAB — BASIC METABOLIC PANEL
ANION GAP: 11 mmol/L (ref 4–13)
BUN/CREAT RATIO: 10 (ref 6–22)
BUN/CREAT RATIO: 10 (ref 6–22)
BUN: 7 mg/dL — ABNORMAL LOW (ref 8–25)
CALCIUM: 9.6 mg/dL (ref 8.5–10.4)
CARBON DIOXIDE: 24 mmol/L (ref 22–32)
CHLORIDE: 104 mmol/L (ref 96–111)
CREATININE: 0.69 mg/dL (ref 0.49–1.10)
ESTIMATED GLOMERULAR FILTRATION RATE: >59 ml/min/1.73m2 (ref >59)
GLUCOSE,NONFAST: 95 mg/dL (ref 65–139)
POTASSIUM: 3.7 mmol/L (ref 3.5–5.1)
SODIUM: 139 mmol/L (ref 136–145)

## 2014-09-06 LAB — URINALYSIS WITH MICROSCOPIC REFLEX IF INDICATED
COLOR: NORMAL
GLUCOSE: NEGATIVE mg/dL
LEUKOCYTES: NEGATIVE
NITRITE: NEGATIVE
PH URINE: 6 (ref 5.0–8.0)

## 2014-09-06 LAB — ALK PHOS (ALKALINE PHOSPHATASE): ALKALINE PHOSPHATASE: 88 U/L (ref <150)

## 2014-09-06 LAB — GAMMA GT: GAMMA GT: 16 U/L (ref 7–50)

## 2014-09-06 LAB — WETMOUNT: WETMOUNT: NONE SEEN — AB

## 2014-09-06 LAB — URINALYSIS MACROSCOPIC WITH REFLEX TO MICROSCOPIC URINALYSIS (CULTURE NOT PERFORMED)
KETONES: 5 mg/dL — AB
PROTEIN: NEGATIVE mg/dL
UROBILINOGEN: NEGATIVE mg/dL

## 2014-09-06 LAB — URINALYSIS, MICROSCOPIC

## 2014-09-06 LAB — HCG, SERUM QUALITATIVE, PREGNANCY: PREGNANCY, SERUM QUALITATIVE: NEGATIVE

## 2014-09-06 LAB — AST (SGOT): AST (SGOT): 16 U/L (ref 8–41)

## 2014-09-06 LAB — ALT (SGPT): ALT (SGPT): 5 U/L (ref <55)

## 2014-09-06 MED ORDER — KETOROLAC 30 MG/ML (1 ML) INJECTION SOLUTION
INTRAMUSCULAR | Status: AC
Start: 2014-09-06 — End: 2014-09-06
  Administered 2014-09-06: 30 mg via INTRAVENOUS
  Filled 2014-09-06: qty 1

## 2014-09-06 MED ORDER — TAMSULOSIN 0.4 MG CAPSULE
0.40 mg | ORAL_CAPSULE | Freq: Every evening | ORAL | Status: DC
Start: 2014-09-06 — End: 2015-09-03

## 2014-09-06 MED ORDER — MORPHINE 4 MG/ML INTRAVENOUS CARTRIDGE
CARTRIDGE | INTRAVENOUS | Status: AC
Start: 2014-09-06 — End: 2014-09-06
  Administered 2014-09-06: 4 mg via INTRAVENOUS
  Filled 2014-09-06: qty 1

## 2014-09-06 MED ORDER — MORPHINE 4 MG/ML INTRAVENOUS CARTRIDGE
4.00 mg | CARTRIDGE | INTRAVENOUS | Status: AC
Start: 2014-09-06 — End: 2014-09-06

## 2014-09-06 MED ORDER — OXYCODONE-ACETAMINOPHEN 5 MG-325 MG TABLET
1.00 | ORAL_TABLET | ORAL | Status: DC | PRN
Start: 2014-09-06 — End: 2015-09-03

## 2014-09-06 MED ORDER — ONDANSETRON HCL (PF) 4 MG/2 ML INJECTION SOLUTION
4.00 mg | INTRAMUSCULAR | Status: AC
Start: 2014-09-06 — End: 2014-09-06

## 2014-09-06 MED ORDER — ONDANSETRON 4 MG DISINTEGRATING TABLET
4.00 mg | ORAL_TABLET | Freq: Three times a day (TID) | ORAL | Status: DC | PRN
Start: 2014-09-06 — End: 2015-09-03

## 2014-09-06 MED ORDER — ONDANSETRON HCL (PF) 4 MG/2 ML INJECTION SOLUTION
INTRAMUSCULAR | Status: AC
Start: 2014-09-06 — End: 2014-09-06
  Administered 2014-09-06: 4 mg via INTRAVENOUS
  Filled 2014-09-06: qty 2

## 2014-09-06 MED ORDER — SODIUM CHLORIDE 0.9 % IV BOLUS
1000.00 mL | INJECTION | Status: AC
Start: 2014-09-06 — End: 2014-09-06
  Administered 2014-09-06: 1000 mL via INTRAVENOUS
  Administered 2014-09-06: 0 mL via INTRAVENOUS

## 2014-09-06 MED ORDER — KETOROLAC 30 MG/ML (1 ML) INJECTION SOLUTION
30.00 mg | INTRAMUSCULAR | Status: AC
Start: 2014-09-06 — End: 2014-09-06

## 2014-09-06 NOTE — ED Provider Notes (Signed)
Department of Emergency Medicine    Attending: Dr. Alphia Moh   Mid-level Provider/Resident: Windell Hummingbird, PA-C  CC: Flank pain  History provided by patient.    HPI:  Chelsea Mccall is a 39 y.o. female presenting to the ED via POV with R flank pain intermittently radiating to her R groin x >24 hours. States pain occurs in waves of intensity. Denies any urinary changes. Pt has been taking Advil without relief. Reports nausea, but no vomiting. Pt has 7-year Hx kidney stones and states this pain feels similar to prior stones, but slightly more severe. Denies any additional complaints. PSHx includes cholecystectomy and appendectomy.     ROS:  Constitutional: No fever or chills  HENT: No headaches or congestion  Cardio: No chest pain  Respiratory: No SOB  GI:  No vomiting or stool changes +nausea  GU:  No urinary changes +R flank pain +R groin pain  All other systems reviewed and are negative.    History:  PMH:    Past Medical History   Diagnosis Date    Migraine      Discharge Medication List as of 09/06/2014 10:27 PM        PSH:    Past Surgical History   Procedure Laterality Date    Hx gall bladder surgery/chole      Hx appendectomy           Social Hx:    History     Social History    Marital Status: Married     Spouse Name: N/A     Number of Children: N/A    Years of Education: N/A     Occupational History    Not on file.     Social History Main Topics    Smoking status: Current Every Day Smoker -- 1.00 packs/day     Types: Cigarettes    Smokeless tobacco: Not on file    Alcohol Use: No    Drug Use: No    Sexual Activity: Not on file     Other Topics Concern    Not on file     Social History Narrative     Family Hx: No family history on file.  Allergies:   Allergies   Allergen Reactions    Naproxen Rash     "Was given with an antibiotic when had wisdom teeth taken out, and doctors are unsure which I am allergic to"       Above history reviewed with patient, changes are as documented.      PE:  Nursing  note and vitals reviewed.    Filed Vitals:    09/06/14 1831   Pulse: 110   Temp: 36.5 C (97.7 F)   Resp: 20   Height: 1.626 m (5' 4")   Weight: 51.7 kg (113 lb 15.7 oz)   SpO2: 98%       Constitutional: Pt is alert and oriented and appears well-developed and well-nourished. No acute distress.   HENT:   Mouth/Throat: Oropharynx is clear and moist.   Eyes: Conjunctivae and extraocular motions are normal. Pupils are equal, round, and reactive to light.   Neck: Normal range of motion. Neck supple.   Cardiovascular: Normal rate, regular rhythm and intact distal pulses.    Pulmonary/Chest: Effort normal and breath sounds normal. No respiratory distress.   Abdominal: Abdomen is soft, nondistended. TTP to RLQ and R CVA.   Vaginal: Cervical polyp at 5 o'clock position. No vaginal discharge. No CMT. No adnexal  masses or tenderness. Chaperoned by CA.  Musculoskeletal: Normal range of motion.   Neurological: Pt is alert and oriented.   Skin: Skin is warm and dry without rash.  Color normal.      Course  MDM      Impression/Plan: 39 y.o. female presenting with flank pain concerning for, but not limited to, kidney stone vs pyelonephritis vs ovarian cyst. Medical Records reviewed.     Will obtain the following labs/imaging and give pt the following medications to alleviate symptoms:   Orders Placed This Encounter    CANCELED: URINE CULTURE,ROUTINE    WETMOUNT(POC)    [place] FEMALE PELVIS    US KIDNEY    XR KUB    CBC/DIFF    BASIC METABOLIC PANEL, NON-FASTING    LIPASE    ALK PHOS (ALKALINE PHOSPHATASE)    ALT (SGPT)    AST (SGOT)    BILIRUBIN, CONJUGATED (DIRECT)    GAMMA GT    URINALYSIS WITH CULTURE REFLEX    URINALYSIS, MICROSCOPIC    HCG, SERUM QUALITATIVE, PREGNANCY    NEISSERIA GONORRHOEAE DNA BY PCR    CHLAMYDIA / GU BY PCR    INSERT & MAINTAIN PERIPHERAL IV ACCESS    ketorolac (TORADOL) 30 mg/mL injection    ondansetron (ZOFRAN) 2 mg/mL injection    NS bolus infusion 1,000 mL    ketorolac (TORADOL)  injection ---Cabinet Override    ondansetron (ZOFRAN) injection ---Cabinet Override    morphine 4 mg/mL injection    morphine injection ---Cabinet Override    tamsulosin (FLOMAX) 0.4 mg Oral Capsule, Sust. Release 24 hr    ondansetron (ZOFRAN ODT) 4 mg Oral Tablet, Rapid Dissolve    oxyCODONE-acetaminophen (PERCOCET) 5-325 mg Oral Tablet       Labs Reviewed   WETMOUNT(POC) - Abnormal; Notable for the following:     WETMOUNT   (*)     Value: NO YEAST SEEN  NO TRICHOMONAS SEEN  CLUE CELLS PRESENT  THE WET PREP IS A PRELIMINARY RAPID TEST AND SHOULD BE CONFIRMED BY OTHER METHODS IF CLINICAL QUESTIONS EXIST      All other components within normal limits   BASIC METABOLIC PANEL, NON-FASTING - Abnormal; Notable for the following:     BUN 7 (*)     All other components within normal limits   URINALYSIS WITH CULTURE REFLEX - Abnormal; Notable for the following:     APPEARANCE CLOUDY (*)     KETONES 5 (*)     BLOOD LARGE (*)     All other components within normal limits   URINALYSIS, MICROSCOPIC - Abnormal; Notable for the following:     RBC'S 39 (*)     SQUAMOUS EPITHELIAL SEVERAL (*)     All other components within normal limits   CBC/DIFF   LIPASE   ALK PHOS (ALKALINE PHOSPHATASE)   ALT (SGPT)   AST (SGOT)   BILIRUBIN, CONJUGATED (DIRECT)   GAMMA GT   HCG, SERUM QUALITATIVE, PREGNANCY   NEISSERIA GONORRHOEAE DNA BY PCR   CHLAMYDIA TRACHOMITIS DNA BY PCR (INHOUSE)     All labs were reviewed.    Radiographical Imaging:  Results for orders placed or performed during the hospital encounter of 09/06/14 (from the past 72 hour(s))   US KIDNEY     Status: None    Narrative    INDICATION: Chelsea Mccall is a 39 y.o. female with right flank pain.   r/o hydronephrosis.    TECHNIQUE: US KIDNEY performed on Sep 06, 2014  8:47 PM.    COMPARISON: CT abdomen and pelvis dated September 06, 2014    FINDINGS: Ultrasound evaluation of the kidneys and bladder preformed for   right flank pain.  Examination shows that the right kidney  measures 10.1 x   3.6 cm and the left kidney measures 10.1 x 4.0 cm.  Parenchymal thickness   and renal echogenicity are within normal limits bilaterally.  There is no   evidence of focal lesions, calculi, or hydronephrosis.  Doppler confirms   arterial and venous blood flow within both kidneys.    The bladder has a calculated volume of 60 cc.  No bladder abnormalities   are noted.      Impression     Unremarkable ultrasound evaluation of the kidneys and bladder.   [place] FEMALE PELVIS     Status: None (Preliminary result)    Narrative    INDICATION: Chelsea Mccall is a 39 y.o. female with RLQ TTP.    TECHNIQUE: [place] FEMALE PELVIS TRANSVAGINAL performed on Sep 06, 2014  8:48   PM.    COMPARISON: CT abdomen and pelvis dated October 10 15    FINDINGS: Trans-vaginal ultrasound examination preformed for lower   quadrant pain.  Last stated menstrual period was August 30, 2014.  The   current examination shows a uterus which is normal in size, measuring 7.4   x 3.5 by 4.0 cm in long, transverse, and AP dimensions.  There are no   focal masses or lesions identified.  The endometrium is normal in   thickness, measuring 8.1 mm.    No significant free fluid is identified.    The right ovary is normal in size measuring 2.0 x 1.1 x 1.9 cm, with a   normal follicular pattern.    Arterial and venous blood flow is confirmed   via color and spectral Doppler.    The right ovary is normal in size measuring 3.3 x 1.9 x 2.7 cm. There is a   simple ovarian cyst measuring 1.8 cm x 1.4 x 1.3 centimeters in size.    Arterial and venous blood flow is confirmed via color and spectral   Doppler.      Impression     Simple 1.8 cm left ovarian cyst.  Otherwise unremarkable   pelvic ultrasound examination.   XR KUB     Status: None (In process)    Narrative    Chelsea Mccall  XR KUB performed on Sep 06, 2014 10:08 PM.    CLINICAL HISTORY: 39 y.o. female with right flank pain. r/o ureteral   stone.    FINDINGS: No definite calculi are seen over  the renal shadows or expected   courses of the ureters although evaluation is limited by overlying bowel   gas.  Surgical clips are noted in the right upper quadrant.  There is   scattered bowel gas predominantly within the colon and also within   scattered nondilated loops of small bowel.      Impression    No definite calculi are seen over the renal shadows or expected courses of   the ureters.          Therapy/Procedures/Course/MDM:    Patient was vitally stable throughout visit.     Patient was given Toradol for pain control and Zofran for nausea control. Additional medical therapy provided included 1 L fluids. Patient had improvement in symptoms over course of ED stay.     Laboratory results remarkable for large  amount of blood in urine, with 39 RBCs, and clue cells present on wet mount.  KUB neg. Kidney US was negative. Female Pelvis US showed L-sided ovarian cyst, but otherwise unremarkable findings. Results were discussed with patient. They were given an opportunity to ask questions.   Suspect kidney stone due to presentation, history of stone and hematuria. Pain improved. Will dc with Zofran, percocet, and flomax. Rx for flagyl (BV treatment) called in to [facility] in [place] after pt was discharged. Pt told to return to ED with new or worsening sx  Consults: None    Disposition: Discharged    Following the above history, physical exam, and studies, the patient was deemed stable and suitable for discharge.  Medication instructions were discussed with the patient/patient's family . Patient was advised to return to the ED with any new, concerning or worsening symptoms and follow up as directed. The patient verbalized understanding of all instructions and had no further questions or concerns.     Clinical Impression:   Encounter Diagnosis   Name Primary?    Kidney stone Yes       Follow Up:   [facility] Emergency Department  [place]  [phone]    As needed, If symptoms  worsen      Prescriptions:   Discharge Medication List as of 09/06/2014 10:27 PM      START taking these medications    Details   ondansetron (ZOFRAN ODT) 4 mg Oral Tablet, Rapid Dissolve 1 Tab (4 mg total) by Sublingual route Every 8 hours as needed for nausea/vomiting, Disp-30 Tab, R-0, Print      oxyCODONE-acetaminophen (PERCOCET) 5-325 mg Oral Tablet Take 1 Tab by mouth Every 4 hours as needed for Pain, Disp-15 Tab, R-0, Print      tamsulosin (FLOMAX) 0.4 mg Oral Capsule, Sust. Release 24 hr Take 1 Cap (0.4 mg total) by mouth Every evening after dinner, Disp-7 Cap, R-0, Print             I am scribing for, and in the presence of, Windell Hummingbird, PA-C, for services provided on 09/06/2014.  Leane Platt, SCRIBE     I personally performed the services described in this documentation, as scribed  in my presence, and it is both accurate  and complete.    Windell Hummingbird, PA-C    The supervising physician was physically present and available for consultation, and did not physically see the patient.    Windell Hummingbird, PA-C  09/07/2014, 01:16        [facility] of Emergency Medicine

## 2014-09-06 NOTE — ED Attending Note (Signed)
I was present and available to evaluate this patient while in the emergency department. This is not an attestation of having personally seen and evaluated the patient, but I was available for consultation.

## 2014-09-06 NOTE — ED Nurses Note (Signed)
Patient taken to US via WC.

## 2014-09-06 NOTE — ED Nurses Note (Signed)
IV lock removed intact.  Bandaged with 2x2 and tape.  No bleeding noted.  Discharged as written, no questions or concerns.  Ambulatory from department under own power.

## 2014-09-07 LAB — CHLAMYDIA TRACHOMITIS DNA BY PCR (INHOUSE): CHLAMYDIA TRACHOMATIS: NEGATIVE

## 2014-09-07 LAB — NEISSERIA GONORRHOEAE DNA BY PCR: NEISSERIA GONORRHOEAE: NEGATIVE

## 2014-10-12 ENCOUNTER — Other Ambulatory Visit (HOSPITAL_COMMUNITY): Payer: Self-pay

## 2014-10-12 ENCOUNTER — Other Ambulatory Visit (HOSPITAL_COMMUNITY): Payer: Self-pay | Admitting: Acute Care

## 2014-10-12 ENCOUNTER — Emergency Department (EMERGENCY_DEPARTMENT_HOSPITAL): Payer: BC Managed Care – PPO

## 2014-10-12 ENCOUNTER — Emergency Department
Admission: EM | Admit: 2014-10-12 | Discharge: 2014-10-12 | Disposition: A | Discharge status: Home / Self Care | Payer: BC Managed Care – PPO | Attending: Emergency Medicine | Admitting: Emergency Medicine

## 2014-10-12 DIAGNOSIS — O200 Threatened abortion: Secondary | ICD-10-CM | POA: Insufficient documentation

## 2014-10-12 DIAGNOSIS — Z3A00 Weeks of gestation of pregnancy not specified: Secondary | ICD-10-CM | POA: Insufficient documentation

## 2014-10-12 DIAGNOSIS — O99331 Smoking (tobacco) complicating pregnancy, first trimester: Secondary | ICD-10-CM | POA: Insufficient documentation

## 2014-10-12 DIAGNOSIS — N939 Abnormal uterine and vaginal bleeding, unspecified: Secondary | ICD-10-CM

## 2014-10-12 DIAGNOSIS — F17210 Nicotine dependence, cigarettes, uncomplicated: Secondary | ICD-10-CM | POA: Insufficient documentation

## 2014-10-12 LAB — BASIC METABOLIC PANEL
ANION GAP: 10 mmol/L (ref 4–13)
BUN/CREAT RATIO: 9 (ref 6–22)
BUN: 7 mg/dL — ABNORMAL LOW (ref 8–25)
CALCIUM: 9.6 mg/dL (ref 8.5–10.4)
CARBON DIOXIDE: 25 mmol/L (ref 22–32)
CHLORIDE: 101 mmol/L (ref 96–111)
CREATININE: 0.8 mg/dL (ref 0.49–1.10)
ESTIMATED GLOMERULAR FILTRATION RATE: >59 mL/min/{1.73_m2} (ref >59)
GLUCOSE,NONFAST: 114 mg/dL (ref 65–139)
POTASSIUM: 3.3 mmol/L — ABNORMAL LOW (ref 3.5–5.1)
SODIUM: 136 mmol/L (ref 136–145)
SODIUM: 136 mmol/L (ref 136–145)

## 2014-10-12 LAB — CBC/DIFF
BASOPHILS: 1 %
BASOS ABS: 0.057 THOU/uL (ref 0.000–0.200)
EOS ABS: 0.063 10*3/uL (ref 0.000–0.500)
EOSINOPHIL: 1 %
HCT: 37.3 % (ref 33.5–45.2)
HGB: 12.6 g/dL (ref 11.2–15.2)
LYMPHOCYTES: 20 %
LYMPHS ABS: 1.429 THOU/uL (ref 1.000–4.800)
MCH: 31.8 pg (ref 27.4–33.0)
MCHC: 33.6 g/dL (ref 32.5–35.8)
MCV: 94.4 fL (ref 78–100)
MONOCYTES: 6 %
MONOCYTES: 6 %
MONOS ABS: 0.463 THOU/uL (ref 0.300–1.000)
MPV: 9.6 fL (ref 7.5–11.5)
PLATELET COUNT: 260 10*3/uL (ref 140–450)
PMN ABS: 5.204 THOU/uL (ref 1.500–7.700)
PMN'S: 72 %
RBC: 3.95 MIL/uL (ref 3.63–4.92)
RDW: 12.3 % (ref 12.0–15.0)
WBC: 7.2 10*3/uL (ref 3.5–11.0)

## 2014-10-12 LAB — MAGNESIUM: MAGNESIUM: 2.4 mg/dL (ref 1.6–2.5)

## 2014-10-12 LAB — URINALYSIS MACROSCOPIC WITH REFLEX TO MICROSCOPIC URINALYSIS (CULTURE NOT PERFORMED)
BILIRUBIN: NEGATIVE
COLOR: NORMAL
GLUCOSE: NEGATIVE mg/dL
KETONES: NEGATIVE mg/dL
LEUKOCYTES: NEGATIVE
NITRITE: NEGATIVE
PH URINE: 6 (ref 5.0–8.0)
PROTEIN: NEGATIVE mg/dL
SPECIFIC GRAVITY, URINE: 1.008 (ref 1.005–1.030)
UROBILINOGEN: NEGATIVE mg/dL

## 2014-10-12 LAB — TYPE AND SCREEN
ABO/RH(D): B POS
ANTIBODY SCREEN: NEGATIVE

## 2014-10-12 LAB — HCG, SERUM QUALITATIVE, PREGNANCY: PREGNANCY, SERUM QUALITATIVE: POSITIVE — AB

## 2014-10-12 LAB — PT/INR
INR: 1.07 (ref 0.80–1.20)
PROTHROMBIN TIME: 12 s (ref 9.0–13.4)

## 2014-10-12 LAB — PTT (PARTIAL THROMBOPLASTIN TIME): APTT: 34.6 s (ref 25.1–36.5)

## 2014-10-12 LAB — URINALYSIS, MICROSCOPIC
RBC'S: 19 /HPF — ABNORMAL HIGH (ref <6)
WBC'S: 1 /HPF (ref <11)

## 2014-10-12 LAB — HCG, PLASMA OR SERUM QUANTITATIVE, PREGNANCY: HCG QUANTITATIVE/PREG: 2625 m[IU]/mL — ABNORMAL HIGH (ref <5)

## 2014-10-12 LAB — PHOSPHORUS: PHOSPHORUS: 4 mg/dL (ref 2.4–4.7)

## 2014-10-12 MED ORDER — SODIUM CHLORIDE 0.9 % IV BOLUS
1000.0000 mL | INJECTION | Status: AC
Start: 2014-10-12 — End: 2014-10-12
  Administered 2014-10-12: 1000 mL via INTRAVENOUS

## 2014-10-12 NOTE — ED Attending Handoff Note (Signed)
6-[redacted] weeks pregnant  Labs

## 2014-10-12 NOTE — ED Nurses Note (Signed)
Patient being discharged at this time. IV was removed and intact. All discharge instructions were reviewed at bedside, pt was informed to stay and get serum quanitive HCG. Patient will call back in an hour for those results.

## 2014-10-12 NOTE — ED Attending Note (Signed)
Note entered in error.  Case seen by Dr. Karie FetchMinardi

## 2014-10-12 NOTE — ED Nurses Note (Signed)
Patient presents to the ED with complaints of vaginal bleeding. Patient believes to be 6-[redacted] weeks pregnant. Heavy bleeding started yesterday and has increased, some abdominal discomfort.

## 2014-10-12 NOTE — ED Provider Notes (Signed)
Department of Emergency Medicine    Attending: Dr. Hyacinth Meeker  Mid-level Provider/Resident: Gwynne Edinger, APRN  CC: Vaginal bleeding while pregnant  History provided by patient.    HPI:  Chelsea Mccall is a 39 y.o. female who is currently 6 or [redacted] weeks pregnant c/o vaginal bleeding. States she has noticed small amount of blood on toilet paper upon wiping her vagina x3 weeks, which increased in amount and became brighter in color today. States she has not yet soaked through a pad today. Reports slight abdominal pressure, but no cramping or pain. No fever or chills. No nausea or vomiting. Reports vaginal itching, but no vaginal discharge. Pt is G4P0 (Hx 3 prior abortions, 1 at age 78 years and 64 at age 34 years old). Denies Hx prior miscarriages. PSHx cholecystectomy and appendectomy. Denies PMHx aside from migraines. Pt has her first OB appointment on 10/29/14. No daily medications. Allergic to unknown antibiotic and possibly Naproxen.    ROS:  Constitutional: No fever or chills  Skin: No rash or diaphoresis  HENT: No headaches or congestion  Eyes: No vision changes   Cardio: No chest pain  Respiratory: No cough or SOB  GI:  No nausea, vomiting or stool changes  GU:  No urinary changes. No vaginal discharge. +vaginal bleeding and itching +lower abdominal pressure  All other systems reviewed and are negative.    History:  PMH:    Past Medical History   Diagnosis Date    Migraine          Discharge Medication List as of 10/12/2014  9:27 PM      CONTINUE these medications which have NOT CHANGED    Details   ondansetron (ZOFRAN ODT) 4 mg Oral Tablet, Rapid Dissolve 1 Tab (4 mg total) by Sublingual route Every 8 hours as needed for nausea/vomiting, Disp-30 Tab, R-0, Print      oxyCODONE-acetaminophen (PERCOCET) 5-325 mg Oral Tablet Take 1 Tab by mouth Every 4 hours as needed for Pain, Disp-15 Tab, R-0, Print      tamsulosin (FLOMAX) 0.4 mg Oral Capsule, Sust. Release 24 hr Take 1 Cap (0.4 mg total) by mouth Every evening  after dinner, Disp-7 Cap, R-0, Print           PSH:    Past Surgical History   Procedure Laterality Date    Hx gall bladder surgery/chole      Hx appendectomy           Social Hx:    History     Social History    Marital Status: Married     Spouse Name: N/A    Number of Children: N/A    Years of Education: N/A     Occupational History    Not on file.     Social History Main Topics    Smoking status: Current Every Day Smoker -- 1.00 packs/day     Types: Cigarettes    Smokeless tobacco: Not on file    Alcohol Use: No    Drug Use: No    Sexual Activity: Not on file     Other Topics Concern    Not on file     Social History Narrative     Family Hx: No family history on file.  Allergies:   Allergies   Allergen Reactions    Naproxen Rash     "Was given with an antibiotic when had wisdom teeth taken out, and doctors are unsure which I am allergic to"  Above history reviewed with patient, changes are as documented.      PE:  Nursing note and vitals reviewed.    Filed Vitals:    10/12/14 1747 10/12/14 2020   BP: 116/58 128/88   Pulse: 120 92   Temp: 36.7 C (98.1 F) 36.7 C (98.1 F)   Resp: 20 16   Height: 1.626 m (5' 4.02")    Weight: 51.4 kg (113 lb 5.1 oz)    SpO2: 99% 100%       Constitutional: Pt is alert and oriented and appears well-developed and well-nourished. No acute distress.   Eyes: Conjunctivae and extraocular motions are normal. Pupils are equal, round, and reactive to light.   Neck: Normal range of motion. Neck supple.   Cardiovascular: Normal rate, regular rhythm and intact distal pulses.    Pulmonary/Chest: Effort normal and breath sounds normal. No respiratory distress.   Abdominal: Abdomen is soft, nontender, nondistended.  Bowel sounds present.   Pelvic: Dark blood with clot. No CMT or adnexal tenderness. Cervical os closed. Chaperoned by PA student.  Musculoskeletal: Normal range of motion.   Neurological: Pt is alert and oriented. GCS 15. Moves all extremities equal and strong.  Pupils 3 mm.   Skin: Skin is warm and dry     Course  MDM      Impression/Plan: 39 y.o. female presenting with vaginal bleeding while pregnant concerning for, but not limited to, threatened miscarriage vs inevitable miscarriage vs vaginal bleeding while pregnant. Medical Records reviewed.     Will obtain the following labs/imaging and give pt the following medications to alleviate symptoms:   Orders Placed This Encounter    BEDSIDE  ED ULTRASOUND    CANCELED: URINE CULTURE,ROUTINE    [place]ET[place](POC)    CANCELED: [place] FEMALE PELVIS    ED TRANSVAGINAL PREG FEMALE PELVIS ULTRASOUND    BASIC METABOLIC PANEL, NON-FASTING    CBC/DIFF    PT/INR    PTT (PARTIAL THROMBOPLASTIN TIME)    URINALYSIS WITH CULTURE REFLEX    URINALYSIS, MICROSCOPIC    MAGNESIUM    PHOSPHORUS    NEISSERIA GONORRHOEAE DNA BY PCR    CHLAMYDIA TRACHOMITIS DNA BY PCR (INHOUSE)    CANCELED: HCG, PLASMA OR SERUM QUANTITATIVE, PREGNANCY    BLOOD/SPECIMEN IN LAB    HCG, SERUM QUALITATIVE, PREGNANCY    HCG, PLASMA OR SERUM QUANTITATIVE, PREGNANCY    CANCELED: ABO & RH    TYPE AND SCREEN    NS bolus infusion 1,000 mL       Labs Reviewed   BASIC METABOLIC PANEL, NON-FASTING - Abnormal; Notable for the following:     POTASSIUM 3.3 (*)     BUN 7 (*)     All other components within normal limits   URINALYSIS WITH CULTURE REFLEX - Abnormal; Notable for the following:     BLOOD LARGE (*)     All other components within normal limits   URINALYSIS, MICROSCOPIC - Abnormal; Notable for the following:     RBC'S 19 (*)     All other components within normal limits   HCG, SERUM QUALITATIVE, PREGNANCY - Abnormal; Notable for the following:     PREGNANCY, SERUM QUALITATIVE POSITIVE (*)     All other components within normal limits   WETM[place](POC)   CBC/DIFF   PT/INR   PTT (PARTIAL THROMBOPLASTIN TIME)   MAGNESIUM   PHOSPHORUS   BLOOD/SPECIMEN IN LAB   NEISSERIA GONORRHOEAE DNA BY PCR   CHLAMYDIA TRACHOMITIS DNA BY PCR (INHOUSE)  TYPE AND SCREEN     All  labs were reviewed.    Radiographical Imaging:  Results for orders placed or performed during the hospital encounter of 10/12/14 (from the past 72 hour(s))   ED US FEMALE PELVIS PREG TV     Status: None    Narrative    *Exam not read by Radiology.  *Refer to ED note for result.        Therapy/Procedures/Course/MDM:    Patient was vitally stable throughout visit.     Patient was given 1 L fluids for rehydration.     Laboratory results were unremarkable.  Bedside US showed no gestational sac and unremarkable findings of the ovaries. Results were discussed with patient. They were given an opportunity to ask questions.   Patient was counseled on threatened miscarriages and given reading material regarding this upon discharge. Patient was agreeable to follow-up with her OB/Gyn.  Consults: None    Disposition: Discharged    Following the above history, physical exam, and studies, the patient was deemed stable and suitable for discharge and she will follow up as scheduled with her OB/Gyn. Prescriptions were not written. Patient was advised to return to the ED with any new, concerning or worsening symptoms and follow up as directed. The patient verbalized understanding of all instructions and had no further questions or concerns.     Clinical Impression:   Encounter Diagnosis   Name Primary?    Threatened miscarriage Yes       Follow Up:   [facility]  [address]  [place] [id number]  [phone]    As needed, If symptoms worsen    Your OBGYN      as scheduled      Prescriptions:   Discharge Medication List as of 10/12/2014  9:27 PM          I am scribing for, and in the presence of, Gwynne EdingerMary Seretha Estabrooks, APRN, for services provided on 10/12/2014.  Hoover BrownsKathryn Baker, SCRIBE     I personally performed the services described in this documentation, as scribed  in my presence, and it is both accurate  and complete.    Donell SievertMary H Darnette Lampron, APRN    The supervising physician was physically present and available for  consultation, and did physically see the patient.    Donell SievertMary H Orvella Digiulio, APRN  10/13/2014, 00:27        [place] Department of Emergency Medicine

## 2014-10-12 NOTE — ED Attending Note (Signed)
I saw and examined the patient.  I directly supervised the patient's care.  I discussed the patient's care with the resident/midlevel and I agree with the findings, diagnosis, and plan as documented in the primary note.  Any exceptions, additions, or corrections are noted below.    MDM - re-evaluated pt, reviewed old records, reviewed labs, reviewed imaging, IV therapy was given    Likely incomplete miscarriage, no IUP on [place], hcg 1300 10 days ago, IUP should be seen by now  Counseled  F/u, ret inst    Procedures: bedside US

## 2014-10-12 NOTE — Procedures (Signed)
Encounter Date: 10/12/2014     Emergency Department Procedure:    Female Pelvic Ultrasound:    Indication:: Vaginal bleeding, Pregnant  Transabdominal Ultrasound Interpretation: No Intrauterine Pregnancy Seen   Transvaginal Ultrasound Interpretation (Performed to obtain more detailed view of Adnexal Structures): No Intrauterine Pregnancy Seen   Attending Physician: Everett GraffJoseph Jordy Hewins  Comments: no IUP, small amount of fluid, likely blood in the endometrial cavity, moderate sized nabothian cysts seen, ovaries and adnexa grossly normal, some visible bowel loops        I was physically present for the key portions of obtaining the US images, have personally reviewed and interpreted the images and agree with the findings as documented.    Everett GraffJoseph Jamirah Zelaya, MD

## 2014-10-15 ENCOUNTER — Emergency Department (HOSPITAL_COMMUNITY): Payer: BC Managed Care – PPO

## 2014-10-15 ENCOUNTER — Emergency Department
Admission: EM | Admit: 2014-10-15 | Discharge: 2014-10-15 | Disposition: A | Discharge status: Home / Self Care | Payer: BC Managed Care – PPO | Attending: Emergency Medicine | Admitting: Emergency Medicine

## 2014-10-15 ENCOUNTER — Other Ambulatory Visit (HOSPITAL_COMMUNITY): Payer: Self-pay

## 2014-10-15 DIAGNOSIS — N939 Abnormal uterine and vaginal bleeding, unspecified: Secondary | ICD-10-CM

## 2014-10-15 DIAGNOSIS — F17210 Nicotine dependence, cigarettes, uncomplicated: Secondary | ICD-10-CM | POA: Insufficient documentation

## 2014-10-15 DIAGNOSIS — O036 Delayed or excessive hemorrhage following complete or unspecified spontaneous abortion: Secondary | ICD-10-CM | POA: Insufficient documentation

## 2014-10-15 DIAGNOSIS — Z3A01 Less than 8 weeks gestation of pregnancy: Secondary | ICD-10-CM | POA: Insufficient documentation

## 2014-10-15 LAB — PT/INR
INR: 1.02 (ref 0.80–1.20)
PROTHROMBIN TIME: 11.4 s (ref 9.0–13.4)

## 2014-10-15 LAB — CBC/DIFF
BASOPHILS: 1 %
BASOS ABS: 0.073 10*3/uL (ref 0.000–0.200)
EOS ABS: 0.162 10*3/uL (ref 0.000–0.500)
EOSINOPHIL: 3 %
HCT: 33.5 % (ref 33.5–45.2)
HGB: 10.9 g/dL — ABNORMAL LOW (ref 11.2–15.2)
LYMPHOCYTES: 22 %
LYMPHS ABS: 1.172 THOU/uL (ref 1.000–4.800)
MCH: 30.7 pg (ref 27.4–33.0)
MCHC: 32.6 g/dL (ref 32.5–35.8)
MCV: 94.2 fL (ref 78–100)
MONOCYTES: 7 %
MONOCYTES: 7 %
MONOS ABS: 0.373 10*3/uL (ref 0.300–1.000)
MPV: 9.5 fL (ref 7.5–11.5)
PLATELET COUNT: 215 10*3/uL (ref 140–450)
PLATELET COUNT: 215 10*3/uL (ref 140–450)
PMN ABS: 3.675 10*3/uL (ref 1.500–7.700)
PMN'S: 67 %
RBC: 3.55 MIL/uL — ABNORMAL LOW (ref 3.63–4.92)
RDW: 12.3 % (ref 12.0–15.0)
WBC: 5.5 THOU/uL (ref 3.5–11.0)

## 2014-10-15 LAB — BASIC METABOLIC PANEL
ANION GAP: 8 mmol/L (ref 4–13)
BUN/CREAT RATIO: 5 — ABNORMAL LOW (ref 6–22)
BUN/CREAT RATIO: 5 — ABNORMAL LOW (ref 6–22)
BUN: 4 mg/dL — ABNORMAL LOW (ref 8–25)
CALCIUM: 9.3 mg/dL (ref 8.5–10.4)
CARBON DIOXIDE: 25 mmol/L (ref 22–32)
CHLORIDE: 109 mmol/L (ref 96–111)
CREATININE: 0.74 mg/dL (ref 0.49–1.10)
ESTIMATED GLOMERULAR FILTRATION RATE: >59 mL/min/{1.73_m2} (ref >59)
GLUCOSE,NONFAST: 76 mg/dL (ref 65–139)
POTASSIUM: 3.6 mmol/L (ref 3.5–5.1)
SODIUM: 142 mmol/L (ref 136–145)

## 2014-10-15 LAB — CHLAMYDIA TRACHOMITIS DNA BY PCR (INHOUSE): CHLAMYDIA TRACHOMATIS: NEGATIVE

## 2014-10-15 LAB — TYPE AND SCREEN
ABO/RH(D): B POS
ANTIBODY SCREEN: NEGATIVE

## 2014-10-15 LAB — HCG, PLASMA OR SERUM QUANTITATIVE, PREGNANCY: HCG QUANTITATIVE/PREG: 551 m[IU]/mL — ABNORMAL HIGH (ref <5)

## 2014-10-15 LAB — NEISSERIA GONORRHOEAE DNA BY PCR: NEISSERIA GONORRHOEAE: NEGATIVE

## 2014-10-15 LAB — PTT (PARTIAL THROMBOPLASTIN TIME): APTT: 35.9 s (ref 25.1–36.5)

## 2014-10-15 MED ORDER — SODIUM CHLORIDE 0.9 % IV BOLUS
1000.00 mL | INJECTION | Status: AC
Start: 2014-10-15 — End: 2014-10-15
  Administered 2014-10-15: 0 mL via INTRAVENOUS
  Administered 2014-10-15: 1000 mL via INTRAVENOUS

## 2014-10-15 MED ORDER — FENTANYL (PF) 50 MCG/ML INJECTION SOLUTION
50.00 ug | INTRAMUSCULAR | Status: AC
Start: 2014-10-15 — End: 2014-10-15
  Administered 2014-10-15: 50 ug via INTRAVENOUS
  Filled 2014-10-15: qty 2

## 2014-10-15 MED ORDER — HYDROCODONE 5 MG-ACETAMINOPHEN 325 MG TABLET
1.00 | ORAL_TABLET | ORAL | Status: DC | PRN
Start: 2014-10-15 — End: 2015-09-03

## 2014-10-15 MED ORDER — ONDANSETRON 4 MG DISINTEGRATING TABLET
4.00 mg | ORAL_TABLET | Freq: Three times a day (TID) | ORAL | Status: DC | PRN
Start: 2014-10-15 — End: 2015-09-03

## 2014-10-15 MED ORDER — IBUPROFEN 600 MG TABLET
600.00 mg | ORAL_TABLET | Freq: Four times a day (QID) | ORAL | Status: DC | PRN
Start: 2014-10-15 — End: 2015-09-03

## 2014-10-15 MED ORDER — MORPHINE 4 MG/ML INTRAVENOUS CARTRIDGE
4.00 mg | CARTRIDGE | INTRAVENOUS | Status: AC
Start: 2014-10-15 — End: 2014-10-15
  Administered 2014-10-15: 4 mg via INTRAVENOUS
  Filled 2014-10-15: qty 1

## 2014-10-15 MED ORDER — ONDANSETRON HCL (PF) 4 MG/2 ML INJECTION SOLUTION
4.00 mg | INTRAMUSCULAR | Status: AC
Start: 2014-10-15 — End: 2014-10-15
  Administered 2014-10-15: 4 mg via INTRAVENOUS
  Filled 2014-10-15: qty 2

## 2014-10-15 NOTE — Procedures (Addendum)
Encounter Date: 10/15/2014     Emergency Department Procedure:    Female Pelvic Ultrasound:    Indication:: Vaginal bleeding, Pregnant  Transvaginal Ultrasound Interpretation (Performed to obtain more detailed view of Adnexal Structures): No Intrauterine Pregnancy Seen   Summary:: Negative within limits & scope of exam   Assisted by:: Emergency US Fellow  Attending Physician: Terance HartBrendan Kathlyne Loud  Comments: Nebothian cysts as seen on previous US. Small amount of free fluid in pelvis.         Leary RocaNicole L Dorinzi, MD  10/15/2014, 21:06    I was physically present for the key portions of obtaining the US images, have personally reviewed and interpreted the images and agree with the findings as documented.    Chad CordialBrenden J Keaghan Bowens, MD  10/17/2014, 08:51

## 2014-10-15 NOTE — ED Nurses Note (Signed)
Patient was seen on Friday with vaginal bleeding during pregnancy and had decrease in HCG at follow up blood draw.  Patient states she has been going through a pad every 30 minutes at home with increased abdominal cramping.  Rating pain 10/10.  Placed 20g PIV to left ac.  Blood drawn and sent to lab.  Patient tolerated well.  [facility] and [facility] started.  Assissted MD with pelvic exam.   Patient denies fevers, chills, or dizziness.  No other needs at this time.

## 2014-10-15 NOTE — Discharge Instructions (Signed)
Call OBGYN for appointment  Take ibuprofen for mild to moderate pain and save Norco for severe pain

## 2014-10-15 NOTE — ED Nurses Note (Signed)
Reviewed AVS with patient and family. Reviewed and given prescriptions and medication handouts. Reviewed and given educational handouts regarding diagnosis. Patient and family verbalized understanding of all discharge teaching. Encouraged to ask questions throughout education. PIV discontinued, pressure dressing applied, no bleeding at the site. Patient ambulatory and discharged with belongings.

## 2014-10-15 NOTE — ED Attending Note (Signed)
ED ATTENDING NOTE:    Note begun by: Chad CordialBrenden J Lenville Hibberd, MD on 10/15/2014 at 7:52 PM    I was physically present and directly supervised this patients care. Patient seen and examined with the resident/MLP, Dr. Sherrie MustacheFisher, and history and exam reviewed. Key elements in addition to and/or correction of that documentation are as follows:    HPI:  Chelsea Mccall is a 39 y.o. female who presents with complaint of vaginal bleeding.  LMP 08/30/14.  Was seen here a few days ago for similar symptoms.  Diagnosed with miscarriage.  US ok.  Came back today increased bleeding and cramping.  Reports going through 1 pad per 30 minutes.  Reporting some light-headedness.  No tissue passing.  Clots and blood.    Reports this is her 3rd pregnancy.  Planned abortions as a teenager.    Further historical details can be found in the resident/MLP note.    ROS:  Otherwise normal unless commented in HPI.    HISTORY:  PMH, PSH, medications, allergies, SH, and FH per resident note. Important aspects of these fields pertaining to today's visit taken into consideration during history/physical and MDM.    PERTINENT PHYSICAL EXAM:  Nursing note and vitals reviewed.    ED Triage Vitals   Enc Vitals Group      BP (Non-Invasive) 10/15/14 1925 140/85 mmHg      Heart Rate 10/15/14 1925 111      Respiratory Rate 10/15/14 1925 20      Temperature 10/15/14 1925 36.9 C (98.4 F)      Temp src --       SpO2-1 10/15/14 1925 99 %      Weight 10/15/14 1925 52.9 kg (116 lb 10 oz)      Height 10/15/14 1925 1.626 m (5\' 4" )      Head Cir --       Peak Flow --       Pain Score --       Pain Loc --       Pain Edu? --       Excl. in GC? --          I have seen and physically examined the patient.  I agree with the physical exam as documented in Dr. Theodis AguasFisher's note.      LABS: Reviewed    IMAGING: Reviewed    EKG: None    PROCEDURES: Bedside US      ED COURSE/MEDICAL DECISION MAKING:    The patient remained stable while in the ED.  Bleeding decreased in ED.  Hgb slightly  decreased from previous.  HCG trending down.  US unchanged from previous.     Patient suitable for discharge.  Patient to follow up with OB-GYN.  The patient was instructed to return to the ED at any time for new, worsening or concerning symptoms. Patient comfortable with and agreeable to plan.        DISPO: Discharged    CLINICAL IMPRESSION:   Encounter Diagnosis   Name Primary?    Vaginal bleeding Yes       Chad CordialBrenden J Raymona Boss, MD  11/01/2014, 10:44

## 2014-10-15 NOTE — ED Nurses Note (Signed)
Report received from RN. Care transferred.

## 2014-10-15 NOTE — ED Provider Notes (Signed)
Department of Emergency Medicine    Attending Physician: [facility]  Resident Physician: Dr.Aryaan Persichetti    CC: Vaginal bleeding while pregnant    History provided by: patient, who is accompanied by her significant other.    HPI  Chelsea Mccall is a 39 y.o. female who presents to the ED with vaginal bleeding. Pt was evaluated at [facility] ED 3 days ago for light vaginal bleeding while 6-[redacted] weeks pregnant (based on LMP 08/30/14). Pt is G4P0 (Hx 3 prior abortions). Pt had blood-work showing HCG level of 2625 (increased from 1313 on blood work 2 weeks ago), female pelvic ultrasound, and was dx with miscarriage at that time. Pt developed lower abdominal cramps and increased vaginal bleeding since last night, since which time she has been saturating 1 pad every 30 minutes and passing clots. States the abdominal cramps occur in "waves" of intensity, associated with nausea and lightheadedness when the pain intensifies. States symptoms "feel like my period, but are 10-times worse." Reports slight BL frontal headache today that is not as severe as her typical migraines. No chest pain or SOB. No vomiting or stool/urinary changes. Allergic to Naproxen and unknown antibiotic medication (given at the same time, developed swelling).      Review of Systems  Constitutional: No fever, chills or weakness +lightheadedness  Skin: No rash or diaphoresis  HENT: No congestion +BL frontal headache  Eyes: No vision changes   Cardio: No chest pain, palpitations or leg swelling   Respiratory: No cough, wheezing or SOB  GI:  No vomiting or stool changes +nausea +lower abdominal cramps  GU:  No urinary changes +vaginal bleeding with clots   MSK: No joint or back pain  Neuro: No seizures or LOC  Psychiatric: No mood changes  All other systems reviewed and are negative.    History:   PMH:    Past Medical History   Diagnosis Date    Migraine          Previous Medications    ONDANSETRON (ZOFRAN ODT) 4 MG ORAL TABLET, RAPID DISSOLVE    1 Tab (4 mg total) by  Sublingual route Every 8 hours as needed for nausea/vomiting    OXYCODONE-ACETAMINOPHEN (PERCOCET) 5-325 MG ORAL TABLET    Take 1 Tab by mouth Every 4 hours as needed for Pain    TAMSULOSIN (FLOMAX) 0.4 MG ORAL CAPSULE, SUST. RELEASE 24 HR    Take 1 Cap (0.4 mg total) by mouth Every evening after dinner     PSH:    Past Surgical History   Procedure Laterality Date    Hx gall bladder surgery/chole      Hx appendectomy           Social Hx:    History     Social History    Marital Status: Married     Spouse Name: N/A    Number of Children: N/A    Years of Education: N/A     Occupational History    Not on file.     Social History Main Topics    Smoking status: Current Every Day Smoker -- 1.00 packs/day     Types: Cigarettes    Smokeless tobacco: Not on file    Alcohol Use: No    Drug Use: No    Sexual Activity: Not on file     Other Topics Concern    Not on file     Social History Narrative     Family Hx: No family history on file.  Allergies:   Allergies   Allergen Reactions    Naproxen Rash     "Was given with an antibiotic when had wisdom teeth taken out, and doctors are unsure which I am allergic to"       Above history reviewed with patient, changes are as documented.    Physical Exam   Nursing notes reviewed.    ED Triage Vitals   Enc Vitals Group      BP (Non-Invasive) 10/15/14 1925 140/85 mmHg      Heart Rate 10/15/14 1925 111      Respiratory Rate 10/15/14 1925 20      Temperature 10/15/14 1925 36.9 C (98.4 F)      Temp src --       SpO2-1 10/15/14 1925 99 %      Weight 10/15/14 1925 52.9 kg (116 lb 10 oz)      Height 10/15/14 1925 1.626 m ( )      Head Cir --       Peak Flow --       Pain Score --       Pain Loc --       Pain Edu? --       Excl. in GC? --      Constitutional: NAD. Oriented  HENT:   Head: Normocephalic and atraumatic.   Mouth/Throat: Oropharynx is clear and moist.   Eyes: EOMI, PERRL   Neck: Trachea midline. Neck supple.  Cardiovascular: RRR, No murmurs, rubs or gallops.  Intact distal pulses.  Pulmonary/Chest: BS equal bilaterally. No respiratory distress. No wheezes, rales or chest tenderness.   Abdominal: BS +. Abdomen soft, no rebound or guarding. Lower abdominal tenderness.        Vaginal: Chaperoned by RN. Fresh to old blood in the vaginal vault. Moderate amount of bleeding from the cervical os. Small tissue noted to the 5 o' clock position of the cervical os.  Musculoskeletal: No edema, tenderness or deformity.  Skin: warm and dry. No rash, erythema, pallor or cyanosis  Psychiatric: normal mood and affect. Behavior is normal.   Neurological: Alert. Grossly intact.    Course  MDM      Impression/Plan: 39 y.o. female presenting with vaginal bleeding while pregnant. Medical Records reviewed.     Will obtain the following labs/imaging and give pt the following medications to alleviate symptoms:   Orders Placed This Encounter    BEDSIDE  MISC PROCEDURE    ED TRANSVAGINAL PREG FEMALE PELVIS ULTRASOUND    BASIC METABOLIC PANEL, NON-FASTING    CBC/DIFF    PT/INR    PTT (PARTIAL THROMBOPLASTIN TIME)    HCG, SERUM QUALITATIVE, PREGNANCY    HCG, PLASMA OR SERUM QUANTITATIVE, PREGNANCY    TYPE AND SCREEN    SCHEDULE FOLLOW-UP OB/GYN - [facility]    NS bolus infusion 1,000 mL    fentaNYL (SUBLIMAZE) 50 mcg/mL injection    ondansetron (ZOFRAN) 2 mg/mL injection    morphine 4 mg/mL injection    Ibuprofen (MOTRIN) 600 mg Oral Tablet    HYDROcodone-acetaminophen (NORCO) 5-325 mg Oral Tablet    ondansetron (ZOFRAN ODT) 4 mg Oral Tablet, Rapid Dissolve       Labs Reviewed   BASIC METABOLIC PANEL, NON-FASTING - Abnormal; Notable for the following:     BUN 4 (*)     BUN/CREAT RATIO 5 (*)     All other components within normal limits   CBC/DIFF - Abnormal; Notable for the following:  RBC 3.55 (*)     HGB 10.9 (*)     All other components within normal limits   HCG, SERUM QUALITATIVE, PREGNANCY - Abnormal; Notable for the following:     PREGNANCY, SERUM QUALITATIVE  POSITIVE (*)     All other components within normal limits   HCG, PLASMA OR SERUM QUANTITATIVE, PREGNANCY - Abnormal; Notable for the following:     HCG QUANTITATIVE/PREG 551 (*)     All other components within normal limits   PT/INR   PTT (PARTIAL THROMBOPLASTIN TIME)   TYPE AND SCREEN     All labs were reviewed.  Therapy/Procedures/Course/MDM:    Patient was vitally stable throughout visit.     Required Fentanyl and morphine for pain control.  Required Zofran for nausea control.  Additional medical therapy provided included 1 L fluids. Patient had improvement in symptoms over course of ED stay. Patient also reported her vaginal bleeding decreased in amount over the course of her stay.    Laboratory results remarkable for HCG 551 which continues to trend down. Hgb with 1.5 decline. Bedside ultrasound grossly unchanged see procedure note.    Pelvic showed mild to moderate bleeding but no profuse bleeding. Patient reports sx improving and bleeding seems to be lighter. Discussed with her that at this time it appears that she will need to follow up with OBGYN and will place another referall to see if she can see them this week rather than her appointment next week. Discussed with her that bleeding and cramps will likely continue but if has worsening of sx or lightheadness or dizziness or other concerning sx to return to the ED. Discussed ibuprofen and heat for mild to mod pain and only take Norco for severe pain.     Results were discussed with patient. They were given an opportunity to ask questions.  Consults: None    Disposition: Discharged      Following the above history, physical exam, and studies, the patient was deemed stable and suitable for discharge and she will follow up with OB/Gyn (referral placed, patient was instructed to call to schedule appointment). Prescriptions were written for Zofran for nausea control and Norco for pain control. Patient was instructed to take Ibuprofen for mild to moderate  pain and Norco for severe pain. Patient was advised to return to the ED with any new, concerning or worsening symptoms and follow up as directed. The patient verbalized understanding of all instructions and had no further questions or concerns.     Clinical Impression:   Encounter Diagnosis   Name Primary?    Vaginal bleeding Yes       Follow Up:   [facility] Emergency Department  [address]  [place] [address]  [phone]    As needed, If symptoms worsen    Ob/Gyn Clinic, [facility]  [address]  [place] [address]  [phone]          Prescriptions:   New Prescriptions    HYDROCODONE-ACETAMINOPHEN (NORCO) 5-325 MG ORAL TABLET    Take 1 Tab by mouth Every 4 hours as needed for Pain    IBUPROFEN (MOTRIN) 600 MG ORAL TABLET    Take 1 Tab (600 mg total) by mouth Four times a day as needed for Pain    ONDANSETRON (ZOFRAN ODT) 4 MG ORAL TABLET, RAPID DISSOLVE    Take 1 Tab (4 mg total) by mouth Every 8 hours as needed for nausea/vomiting  I am scribing for, and in the presence of, Dr.Suhaila Troiano for services provided on 10/15/2014.  Chelsea Mccall, SCRIBE     I personally performed the services described in this documentation, as scribed  in my presence, and it is both accurate  and complete.    Chelsea Finesaylor Aleea Hendry, MD  Chelsea Finesaylor Madisun Hargrove, MD  10/16/2014, 05:00

## 2014-10-16 ENCOUNTER — Encounter (INDEPENDENT_AMBULATORY_CARE_PROVIDER_SITE_OTHER): Payer: Self-pay

## 2014-10-17 ENCOUNTER — Ambulatory Visit (HOSPITAL_BASED_OUTPATIENT_CLINIC_OR_DEPARTMENT_OTHER): Payer: BC Managed Care – PPO | Admitting: Obstetrics & Gynecology

## 2014-10-18 LAB — WETMOUNT: WETMOUNT: NONE SEEN

## 2014-10-23 ENCOUNTER — Ambulatory Visit (HOSPITAL_BASED_OUTPATIENT_CLINIC_OR_DEPARTMENT_OTHER): Payer: BC Managed Care – PPO | Admitting: Obstetrics & Gynecology

## 2014-10-29 ENCOUNTER — Encounter (HOSPITAL_BASED_OUTPATIENT_CLINIC_OR_DEPARTMENT_OTHER): Payer: MEDICAID | Admitting: Obstetrics & Gynecology

## 2014-12-20 ENCOUNTER — Ambulatory Visit (INDEPENDENT_AMBULATORY_CARE_PROVIDER_SITE_OTHER): Payer: BC Managed Care – PPO | Admitting: Urology

## 2015-04-06 ENCOUNTER — Other Ambulatory Visit: Payer: Self-pay

## 2015-08-14 ENCOUNTER — Telehealth (HOSPITAL_COMMUNITY): Payer: Self-pay | Admitting: LICENSED CLINICAL SOCIAL WORKER

## 2015-09-03 ENCOUNTER — Other Ambulatory Visit (HOSPITAL_BASED_OUTPATIENT_CLINIC_OR_DEPARTMENT_OTHER): Payer: Self-pay | Admitting: Obstetrics & Gynecology

## 2015-09-03 ENCOUNTER — Encounter (HOSPITAL_BASED_OUTPATIENT_CLINIC_OR_DEPARTMENT_OTHER): Payer: Self-pay | Admitting: Obstetrics & Gynecology

## 2015-09-03 ENCOUNTER — Ambulatory Visit: Payer: Self-pay | Attending: Obstetrics & Gynecology | Admitting: Obstetrics & Gynecology

## 2015-09-03 ENCOUNTER — Ambulatory Visit: Payer: BC Managed Care – PPO | Attending: Obstetrics & Gynecology | Admitting: Obstetrics & Gynecology

## 2015-09-03 VITALS — BP 100/62 | Ht 64.0 in | Wt 118.4 lb

## 2015-09-03 DIAGNOSIS — Z3491 Encounter for supervision of normal pregnancy, unspecified, first trimester: Secondary | ICD-10-CM

## 2015-09-03 DIAGNOSIS — Z3401 Encounter for supervision of normal first pregnancy, first trimester: Secondary | ICD-10-CM | POA: Insufficient documentation

## 2015-09-03 DIAGNOSIS — Z1151 Encounter for screening for human papillomavirus (HPV): Secondary | ICD-10-CM | POA: Insufficient documentation

## 2015-09-03 DIAGNOSIS — Z36 Encounter for antenatal screening of mother: Secondary | ICD-10-CM | POA: Insufficient documentation

## 2015-09-03 DIAGNOSIS — Z124 Encounter for screening for malignant neoplasm of cervix: Secondary | ICD-10-CM | POA: Insufficient documentation

## 2015-09-03 DIAGNOSIS — O99331 Smoking (tobacco) complicating pregnancy, first trimester: Secondary | ICD-10-CM | POA: Insufficient documentation

## 2015-09-03 DIAGNOSIS — Z3490 Encounter for supervision of normal pregnancy, unspecified, unspecified trimester: Secondary | ICD-10-CM

## 2015-09-03 DIAGNOSIS — Z3A09 9 weeks gestation of pregnancy: Secondary | ICD-10-CM | POA: Insufficient documentation

## 2015-09-03 MED ORDER — PRENATAL VITS 119-IRON FUM 29 MG-FOLIC ACID 1 MG-DOCUSATE 25 MG TABLET
1.0000 | ORAL_TABLET | Freq: Every day | ORAL | 12 refills | Status: DC
Start: 2015-09-03 — End: 2022-03-25

## 2015-09-03 NOTE — H&P (Signed)
[place] Department of Obstetric & Gynecology      INITIAL OBSTETRICAL VISIT    PATIENT: Chelsea Mccall  CHART NUMBER: [id number]  DATE OF SERVICE: 09/03/2015      CC: Pregnancy    HPI: Chelsea Mccall is a 40 y.o. G1P0 who presents to clinic for an initial obstetrical visit. The patient's LMP was 07/02/15, but uncertain. This was an unplanned pregnancy. She had some red spotting on Monday last week.  She is happy but nervou about being pregnant. Her boyfriend is on some type of injection medication for narcotic addiction and she thought this medication would make him sterile. She is nervous because she had a miscarriage in in the Fall.  Her sister-in-law delivered a baby last night in the setting of eclampsia and did not know she was pregnancy. Today, the patient is without complaints. She has had some breast tenderness which is decreasing over time. Occasional headaches, minimal nausea. 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  She last took this kind of medication in December, which was prescribed to her. She is no longer abusing the medication.     REVIEW OF SYSTEMS:   See HPI, all other system negative    OBSTETRICAL HISTORY:  OB History     Gravida Para Term Preterm AB TAB SAB Ectopic Multiple Living    4    3               GYNECOLOGIC HISTORY:  Menarche: 18   Menses: irregular   Last pap: unsure - ?8 years ago  H/o abnormal paps: no  H/o STIs: +chlamydia - 1998, treated  Sexually active: yes    PAST MEDICAL HISTORY:  Past Medical History:   Diagnosis Date    Kidney stone     Migraine            PAST SURGICAL HISTORY:  Past Surgical History:   Procedure Laterality Date    HX APPENDECTOMY      HX CHOLECYSTECTOMY             FAMILY HISTORY:  No family history on file.        SOCIAL HISTORY:  Social History     Social History    Marital status: Married     Spouse name: N/A    Number of children: N/A    Years of education: N/A     Occupational History    Not on  file.     Social History Main Topics    Smoking status: Current Every Day Smoker     Packs/day: 1.00     Types: Cigarettes    Smokeless tobacco: Not on file    Alcohol use No    Drug use: No    Sexual activity: Yes     Partners: Male     Birth control/ protection: None      Comment: same partner 7 months      Other Topics Concern    Not on file     Social History Narrative    No narrative on file    Lives in [place] with boyfriend. 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 Denies DV. +seatbelts, no guns in home.  Smoking 1/2 pack per day, trying to quit.    MEDICATIONS:   Current Outpatient Prescriptions   Medication Sig    PNV119-iron fumarate-FA-DSS 29 mg iron- 1 mg-25 mg Oral Tablet Take 1 Tab by mouth Once a day  PRENATAL #108-IRON,CARBONYL-FA ORAL Take by mouth      ALLERGIES:  Naproxen     PHYSICAL EXAMINATION:   Vitals:    09/03/15 0922   BP: 100/62   Weight: 53.7 kg (118 lb 6.2 oz)   Height: 1.626 m ( )     General: alert, pleasant, in no acute distress  HEENT: NC/AT, EOMI, neck supple, no thyromegaly  Heart: S1/S2 audible, RRR, no murmurs, rubs, or gallops  Lungs: clear to auscultation bilaterally  Breasts: symmetric, smooth, no skin retractions, no masses  Abd: soft, nontender, nondistended  Pelvic:  Normal external genitalia  Vagina: well-estrogenized, no lesions, no discharge  Cervix: no lesions  Uterus: enlarged to 5-week size, nontender  Adnexa: nontender, no masses    Ext: symmetric, no edema  Neuro: grossly normal    BSUS: gestational sac and yolk sac, no definitive fetal pole seen    ASSESSMENT/PLAN: 40 y.o. G1P0 at [redacted]w[redacted]d by LMP, 4-5 weeks based on [place] today for new OB visit. Pregnancy is complicated by smoking, prior history of narcotic abuse.    1. Routine prenatal: Taking PNV, GC/CT, pap today.  Pelvic US in 1-2 weeks to assess viability. Defer new OB until confirmation of normal pregnancy.   2. Discussed genetic screening: patient desires cell free DNA testing  3. Smoking: encouraged  cessation, discussed strategies for cutting back, quitting    Follow up appointment in approximately 4 weeks.    Vanessa Kick, MD 09/03/2015, 12:26  [facility]  Department of Obstetrics & Gynecology

## 2015-09-04 ENCOUNTER — Telehealth (HOSPITAL_BASED_OUTPATIENT_CLINIC_OR_DEPARTMENT_OTHER): Payer: Self-pay | Admitting: Obstetrics & Gynecology

## 2015-09-04 LAB — NEISSERIA GONORRHOEAE DNA BY PCR: NEISSERIA GONORRHOEAE PCR: NOT DETECTED

## 2015-09-04 LAB — CHLAMYDIA TRACHOMITIS DNA BY PCR (INHOUSE): CHLAMYDIA TRACHOMATIS PCR: NOT DETECTED

## 2015-09-04 MED ORDER — NITROFURANTOIN MONOHYDRATE/MACROCRYSTALS 100 MG CAPSULE
100.00 mg | ORAL_CAPSULE | Freq: Two times a day (BID) | ORAL | 0 refills | Status: AC
Start: 2015-09-04 — End: 2015-09-11

## 2015-09-04 NOTE — Telephone Encounter (Signed)
Attempted to call patient regarding E. Coli UTI from yesterday's urine culture. No answer, no mailbox.  Rx for Macrobid sent to patient's pharmacy. Will continue to try to reach patient with results.     Vanessa Kick, MD  09/04/2015, 10:55

## 2015-09-05 LAB — URINE CULTURE: URINE CULTURE: >100000 — AB

## 2015-09-06 ENCOUNTER — Emergency Department (HOSPITAL_COMMUNITY): Payer: Self-pay

## 2015-09-11 NOTE — Progress Notes (Signed)
I left a message for the patient to return my call.  Moss McSarah C Archer Vise, RN  09/11/2015, 14:50

## 2015-09-12 ENCOUNTER — Other Ambulatory Visit (HOSPITAL_BASED_OUTPATIENT_CLINIC_OR_DEPARTMENT_OTHER): Payer: Self-pay

## 2015-09-12 LAB — HISTORICAL CYTOPATHOLOGY-GYN (PAP AND HPV TESTS)

## 2015-09-13 ENCOUNTER — Ambulatory Visit
Admission: RE | Admit: 2015-09-13 | Discharge: 2015-09-13 | Disposition: A | Discharge status: Home / Self Care | Payer: BC Managed Care – PPO | Source: Ambulatory Visit | Attending: Obstetrics & Gynecology | Admitting: Obstetrics & Gynecology

## 2015-09-13 ENCOUNTER — Other Ambulatory Visit (HOSPITAL_BASED_OUTPATIENT_CLINIC_OR_DEPARTMENT_OTHER): Payer: Self-pay | Admitting: Obstetrics & Gynecology

## 2015-09-13 ENCOUNTER — Encounter (HOSPITAL_BASED_OUTPATIENT_CLINIC_OR_DEPARTMENT_OTHER): Payer: Self-pay | Admitting: Obstetrics & Gynecology

## 2015-09-13 ENCOUNTER — Telehealth (HOSPITAL_BASED_OUTPATIENT_CLINIC_OR_DEPARTMENT_OTHER): Payer: Self-pay | Admitting: Obstetrics & Gynecology

## 2015-09-13 DIAGNOSIS — Z331 Pregnant state, incidental: Secondary | ICD-10-CM | POA: Insufficient documentation

## 2015-09-13 DIAGNOSIS — O30011 Twin pregnancy, monochorionic/monoamniotic, first trimester: Secondary | ICD-10-CM

## 2015-09-13 DIAGNOSIS — Z3490 Encounter for supervision of normal pregnancy, unspecified, unspecified trimester: Secondary | ICD-10-CM

## 2015-09-13 DIAGNOSIS — O30039 Twin pregnancy, monochorionic/diamniotic, unspecified trimester: Secondary | ICD-10-CM | POA: Insufficient documentation

## 2015-09-13 MED ORDER — NITROFURANTOIN MONOHYDRATE/MACROCRYSTALS 100 MG CAPSULE
100.00 mg | ORAL_CAPSULE | Freq: Two times a day (BID) | ORAL | 0 refills | Status: AC
Start: 2015-09-13 — End: 2015-09-20

## 2015-09-13 MED ORDER — FOLIC ACID 1 MG TABLET
1.0000 mg | ORAL_TABLET | Freq: Every day | ORAL | 12 refills | Status: DC
Start: 2015-09-13 — End: 2018-02-13

## 2015-09-13 NOTE — Addendum Note (Signed)
Addended by: Vanessa KickTSON, Phelan Goers on: 09/13/2015 04:00 PM     Modules accepted: Orders

## 2015-09-13 NOTE — Telephone Encounter (Signed)
Called patient to discuss ultrasound findings of monochorionic twin pregnancy as well as untreated UTI.  Female answered phone, then hung up.  Rx sent to patient's pharmacy for additional 1 mg of folic acid daily given twins. Will try calling patient again next week.    Vanessa KickSarah Gurtaj Ruz, MD  09/13/2015, 16:01

## 2015-09-13 NOTE — Telephone Encounter (Signed)
Patient's boyfriend returned phone call.  Patient has new cell phone [phone].  Called patient and discussed diagnosis of likely mono-mono vs mono-di twin pregnancy and need for [facility].  Also discussed recommendation for additional folic acid and UTI diagnosed at first prenatal visit. Rx for Macrobid & folate sent to pharmacy. Patient understands.  Will plan to start baby aspirin at next clinic visit. EDD updated. Will plan repeat US at ~10 weeks with MFM to better assess chorionicity.    Vanessa KickSarah Leitha Hyppolite, MD  09/13/2015, 16:30

## 2015-09-19 ENCOUNTER — Other Ambulatory Visit (HOSPITAL_BASED_OUTPATIENT_CLINIC_OR_DEPARTMENT_OTHER): Payer: Self-pay

## 2015-09-19 DIAGNOSIS — Z3490 Encounter for supervision of normal pregnancy, unspecified, unspecified trimester: Secondary | ICD-10-CM

## 2015-09-20 LAB — HUMAN PAPILLOMA VIRUS (HPV) BY PCR WITH HIGH RISK GENOTYPING (THINPREP)
HPV OTHER: NEGATIVE
HPV16 PCR: NEGATIVE
HPV18 PCR: NEGATIVE

## 2015-09-27 ENCOUNTER — Encounter (HOSPITAL_BASED_OUTPATIENT_CLINIC_OR_DEPARTMENT_OTHER): Payer: Self-pay

## 2015-10-01 ENCOUNTER — Encounter (INDEPENDENT_AMBULATORY_CARE_PROVIDER_SITE_OTHER): Payer: Self-pay

## 2015-10-01 ENCOUNTER — Ambulatory Visit (HOSPITAL_BASED_OUTPATIENT_CLINIC_OR_DEPARTMENT_OTHER): Payer: Self-pay | Admitting: Gynecology

## 2015-10-01 ENCOUNTER — Ambulatory Visit: Payer: Self-pay | Attending: Obstetrics & Gynecology | Admitting: Obstetrics & Gynecology

## 2015-10-01 VITALS — BP 92/58 | Wt 114.2 lb

## 2015-10-01 DIAGNOSIS — F1121 Opioid dependence, in remission: Secondary | ICD-10-CM

## 2015-10-01 DIAGNOSIS — Z3490 Encounter for supervision of normal pregnancy, unspecified, unspecified trimester: Secondary | ICD-10-CM

## 2015-10-01 DIAGNOSIS — N841 Polyp of cervix uteri: Secondary | ICD-10-CM

## 2015-10-01 DIAGNOSIS — O09529 Supervision of elderly multigravida, unspecified trimester: Secondary | ICD-10-CM

## 2015-10-01 DIAGNOSIS — O30001 Twin pregnancy, unspecified number of placenta and unspecified number of amniotic sacs, first trimester: Secondary | ICD-10-CM

## 2015-10-01 DIAGNOSIS — Z3A09 9 weeks gestation of pregnancy: Secondary | ICD-10-CM | POA: Insufficient documentation

## 2015-10-01 DIAGNOSIS — O209 Hemorrhage in early pregnancy, unspecified: Secondary | ICD-10-CM

## 2015-10-01 LAB — CBC
HCT: 29.6 % — ABNORMAL LOW (ref 33.5–45.2)
HGB: 10.3 g/dL — ABNORMAL LOW (ref 11.2–15.2)
MCH: 32.7 pg (ref 27.4–33.0)
MCHC: 34.7 g/dL (ref 32.5–35.8)
MCV: 94.3 fL (ref 78.0–100.0)
MPV: 9.7 fL (ref 7.5–11.5)
PLATELETS: 200 x10ˆ3/uL (ref 140–450)
RBC: 3.14 x10ˆ6/uL — ABNORMAL LOW (ref 3.63–4.92)
RDW: 12.6 % (ref 12.0–15.0)
WBC: 6.5 x10ˆ3/uL (ref 3.5–11.0)

## 2015-10-01 LAB — ABO/RH AND ANTIBODY SCREEN
ABO/RH(D): B POS
ANTIBODY SCREEN: NEGATIVE

## 2015-10-01 LAB — RUBELLA VIRUS IGG AB: RUBELLA IGG ANTIBODY: 13.8 IU/mL

## 2015-10-01 MED ORDER — PROGESTERONE MICRONIZED 200 MG CAPSULE
200.0000 mg | ORAL_CAPSULE | Freq: Every evening | ORAL | 0 refills | Status: DC
Start: 2015-10-01 — End: 2015-11-15

## 2015-10-01 MED ORDER — PROGESTERONE MICRONIZED 200 MG CAPSULE
200.00 mg | ORAL_CAPSULE | Freq: Every evening | ORAL | 0 refills | Status: DC
Start: 2015-10-01 — End: 2015-10-01

## 2015-10-01 NOTE — Progress Notes (Signed)
Request for High Risk OB Consult from: Dr. Vanessa KickSarah Mccall     Reason for request: Twin Gestation - Unknown Chorionicity     Records reviewed    Age: 40    Gravida/Para: G4P0030    LMP:   07/02/15                                 EDD: 04/07/2016    Ultrasound on: 09/13/2015= 6383w6d for an EDD: 05/02/2016    Best EDD: 05/02/2016    Current gestational age on: 10/01/2015= 7055w3d       Information:  AMA  U/S 09/13/15: A twin intrauterine pregnancy which appears to be monochorionic and possible monoamniotic as no separating membrane is seen.  U/S 10/01/2015: definitive heartbeat seen on 1 embryo, uncertain on the second   Vaginal Bleeding in 1st Trimester - Blood type B +  Vaginal bleeding: likely source cervical polyp, removed today. Recommend pelvic rest, progesterone supplementation x 2 weeks to minimize cramping, irritation given manipulation of endocervical canal during procedure. Prometrium 200 mg PO x 2 weeks.  Smoker  Prior history of narcotic abuse - patient states last use was in December     Will Schedule for High Risk OB Consult and Ultrasound <14 weeks

## 2015-10-01 NOTE — Progress Notes (Signed)
[place] Department of Obstetric & Gynecology      RETURN OBSTETRICAL ENCOUNTER    PATIENT: Chelsea Mccall  CHART NUMBER: [id number]  DATE OF SERVICE: 10/01/2015    S: 40 y.o. G4P0030 at 8528w3d by US here for routine ob care. First US showed ?mono-mono vs mono-di twins. She went to the ED 3 times last week for bleeding. On Sunday the bleeding started during intercourse and was extremely heavy. She went to the ED and had an US that showed a normal heart beat on both embryos. Bleeding stopped while in the ED. They did not perform a pelvic exam. She believes she was told there was no evidence of subchorionic hemorrhage. She denies any cramping. No nausea/vomiting.     O:   BP (!) 92/58   Wt 51.8 kg (114 lb 3.2 oz)   LMP 07/02/2015 (Approximate)   BMI 19.6 kg/m2    Gen: NAD  Abd: soft, non tender, gravid  Ext: no edema     US: definitive heartbeat seen on 1 embryo, uncertain on the second.     A/P: 40 y.o. G4P0030 at 528w3d with mono-mono vs mono-di twins. Doing well. Pregnancy is complicated by vaginal bleeding.     1. Routine prenatal: Taking PNV. Pap neg/neg, GC/CT neg from last visit. New OB labs today.  2. Twins: First US suggests monochorionic.  Will refer to MFM for consultation and repeat US to determine amnionicity. Will need to start baby ASA at 12-14 weeks.  3. Vaginal bleeding: likely source cervical polyp, removed today.  Recommend pelvic rest, progesterone supplementation x 2 weeks to minimize cramping, irritation given manipulation of endocervical canal during procedure. Prometrium 200 mg PO x 2 weeks.    Disposition: RTC PRN pending MFM recommendations for pregnancy management/care.      Vanessa KickSarah Shanon Seawright, MD 10/01/2015, 08:34  [facility] [place]  Department of Obstetrics & Gynecology

## 2015-10-01 NOTE — Procedures (Signed)
On speculum exam, a small ~1/2 cm polyp was noted at the cervical os. Verbal consent was obtained for removal. The polyp was grasped with a ring forceps and partially removed. The remainder was then grasped with a hemostat clamp and removed.  Moderate bleeding was noted from the site. Silver nitrate was applied with good hemostasis.     The patient tolerated the procedure well. Prometrium 200 mg PO daily x 2 weeks given for uterine cramping.    Vanessa KickSarah Deloras Reichard, MD  10/01/2015, 10:30

## 2015-10-02 ENCOUNTER — Encounter (HOSPITAL_BASED_OUTPATIENT_CLINIC_OR_DEPARTMENT_OTHER): Payer: Self-pay | Admitting: Obstetrics & Gynecology

## 2015-10-02 LAB — HISTORICAL SURGICAL PATHOLOGY SPECIMEN

## 2015-10-02 LAB — SERUM SYPHILIS TEST: RPR QUALITATIVE: NONREACTIVE

## 2015-10-03 ENCOUNTER — Encounter (HOSPITAL_BASED_OUTPATIENT_CLINIC_OR_DEPARTMENT_OTHER): Payer: BC Managed Care – PPO | Admitting: Obstetrics & Gynecology

## 2015-10-09 ENCOUNTER — Encounter (INDEPENDENT_AMBULATORY_CARE_PROVIDER_SITE_OTHER): Payer: Self-pay

## 2015-10-09 ENCOUNTER — Ambulatory Visit: Payer: PPO | Attending: Maternal & Fetal Medicine

## 2015-10-09 ENCOUNTER — Ambulatory Visit (HOSPITAL_BASED_OUTPATIENT_CLINIC_OR_DEPARTMENT_OTHER): Payer: PPO

## 2015-10-09 VITALS — BP 110/70 | Ht 63.47 in | Wt 116.2 lb

## 2015-10-09 DIAGNOSIS — O09529 Supervision of elderly multigravida, unspecified trimester: Secondary | ICD-10-CM

## 2015-10-09 DIAGNOSIS — O30031 Twin pregnancy, monochorionic/diamniotic, first trimester: Secondary | ICD-10-CM

## 2015-10-09 DIAGNOSIS — F1990 Other psychoactive substance use, unspecified, uncomplicated: Secondary | ICD-10-CM

## 2015-10-09 DIAGNOSIS — O99331 Smoking (tobacco) complicating pregnancy, first trimester: Secondary | ICD-10-CM | POA: Insufficient documentation

## 2015-10-09 DIAGNOSIS — O99011 Anemia complicating pregnancy, first trimester: Secondary | ICD-10-CM | POA: Insufficient documentation

## 2015-10-09 DIAGNOSIS — D649 Anemia, unspecified: Secondary | ICD-10-CM

## 2015-10-09 DIAGNOSIS — Z3A10 10 weeks gestation of pregnancy: Secondary | ICD-10-CM | POA: Insufficient documentation

## 2015-10-09 DIAGNOSIS — O30001 Twin pregnancy, unspecified number of placenta and unspecified number of amniotic sacs, first trimester: Secondary | ICD-10-CM | POA: Insufficient documentation

## 2015-10-09 DIAGNOSIS — O209 Hemorrhage in early pregnancy, unspecified: Secondary | ICD-10-CM

## 2015-10-09 DIAGNOSIS — O208 Other hemorrhage in early pregnancy: Secondary | ICD-10-CM

## 2015-10-09 DIAGNOSIS — O09521 Supervision of elderly multigravida, first trimester: Secondary | ICD-10-CM

## 2015-10-09 DIAGNOSIS — F1121 Opioid dependence, in remission: Secondary | ICD-10-CM

## 2015-10-09 DIAGNOSIS — IMO0002 Reserved for concepts with insufficient information to code with codable children: Secondary | ICD-10-CM | POA: Insufficient documentation

## 2015-10-09 DIAGNOSIS — O99330 Smoking (tobacco) complicating pregnancy, unspecified trimester: Secondary | ICD-10-CM | POA: Insufficient documentation

## 2015-10-09 DIAGNOSIS — O99321 Drug use complicating pregnancy, first trimester: Secondary | ICD-10-CM | POA: Insufficient documentation

## 2015-10-09 NOTE — H&P (Addendum)
[facility] DEPARTMENT OF OBSTETRICS AND GYNECOLOGY    MATERNAL FETAL MEDICINE  INITIAL PATIENT CONSULTATION    PATIENT: Chelsea Mccall   CHART NUMBER: [id number]   DATE OF SERVICE: 10/09/2015         REFERRING PHYSICIAN: Dr. Loistine Chance  REASON FOR REFERRAL: Twin gestation unknown chorionicity       HPI: Chelsea Mccall is a 40 y.o. G87P0030 female at [redacted]w[redacted]d gestation who presents in consultation from Dr. Loistine Chance for twin gestation of unknown chorionicity. Pt is doing well today. She denies vaginal bleeding and cramping. Feels mildly fatigued. Denies N/V. No CP, SOB, calf tenderness. Mood is stable. Has been stressed recently as she is moving to [place] from [place]. Pregnancy is further complicated by first trimester bleeding, normocytic anemia, pain pill use and tobacco use.     Dr. Loistine Chance removed a cervical polyp on 10/01/15 in the office. Pt was given 2 weeks of Prometrium for uterine cramping. Pt denies bleeding since. Rh positive. On prenatal labs it was discovered the patient has a normocytic anemia. Her ancestry [place]. She has mild fatigue, but this started with pregnancy. Had recent blood loss with vaginal bleeding. No dizziness. The patient smokes about 1 ppd, this is a recent increase. The patient has a history of pain pill abuse following a surgery. She had weaned herself off, but is now taking a Vicodin or percocet every 1-2 days. These pills are not prescribed to her.     Dating Summary     Working EDD: 05/02/16 set by Vanessa Kick, MD on 09/13/15 based on Ultrasound on 09/13/15       Based On EDD GA Dif GA User Date    Last Menstrual Period on 07/02/15 (Approximate) 04/07/16 +[redacted]w[redacted]d  Vanessa Kick, MD 09/13/15    Ultrasound on 09/13/15 05/02/16 Working [redacted]w[redacted]d Vanessa Kick, MD 09/13/15          OB History   Gravida Para Term Preterm AB SAB TAB Ectopic Multiple Living   # Outcome Date GA Lbr Len/2nd Weight Sex Delivery Anes PTL Lv   4 Current            3 SAB 2016           2  AB            1 AB                   REVIEW OF SYSTEMS: alls systems negative except per HPI    PAST MEDICAL HISTORY:  Past Medical History:   Diagnosis Date    Kidney stone     Migraine    Denies history of blood transfusion  Immunizations UTD. Has had a recent TB test which was negative for her work.     PAST SURGICAL HISTORY:  Past Surgical History:   Procedure Laterality Date    HX APPENDECTOMY      HX CHOLECYSTECTOMY      HX WISDOM TEETH EXTRACTION         FAMILY HISTORY:  Family History   Problem Relation Age of Onset    Breast Cancer Maternal Grandmother     Breast Cancer Paternal Grandmother     Bleeding Prob Neg Hx     Congenital Defect Neg Hx      SOCIAL HISTORY:  Social History   Substance Use Topics    Smoking status: Current Every Day Smoker  Packs/day: 1.00     Types: Cigarettes    Smokeless tobacco: Never Used    Alcohol use No   Drug history: using non-prescribed Vicodin or Percocet. One pill per day.     CURRENT MEDICATIONS:   Current Outpatient Prescriptions   Medication Sig    folic acid (FOLVITE) 1 mg Oral Tablet Take 1 Tab (1 mg total) by mouth Once a day    PNV119-iron fumarate-FA-DSS 29 mg iron- 1 mg-25 mg Oral Tablet Take 1 Tab by mouth Once a day    progesterone micronized (PROMETRIUM) 200 mg Oral Capsule Take 1 Cap (200 mg total) by mouth Every night       ALLERGIES:  Allergies   Allergen Reactions    Naproxen Rash     "Was given with an antibiotic when had wisdom teeth taken out, and doctors are unsure which I am allergic to"   Denies allergy to latex/shellfish/contrast    PHYSICAL EXAMINATION:   Weight: 52.7 kg (116 lb 2.9 oz)  BP (Non-Invasive): 110/70  Protein: Negative  Glucose: Negative  Ketones: Negative  UA Comments: small blood  General: Alert and Oriented x3, no acute distress  HEENT: NC/AT, EOMI, No LA or thyromegaly   Heart: S1/S2 audible, RRR, no murmurs, rubs, or gallops  Chest: CTA b/l, no rales, rhonchi, or wheezing  Abd: soft, nontender  Ext: no edema or  calf tenderness  Neuro: CN II-XII grossly intact  Psych: normal affect     LABS:   10/01/2015 09:18   WBC 6.5   HGB 10.3 (L)   HCT 29.6 (L)   PLATELET COUNT 200   RBC 3.14 (L)   MCV 94.3   MCHC 34.7   MCH 32.7   RDW 12.6   MPV 9.7       ULTRASOUND today: monochorionic diamniotic twin pregnancy, subjectively fetus b appears smaller with less fluid (see report for full details)       ASSESSMENT/PLAN: 40 y.o. G4P0030 at 6520w4d     Mono Di Twin Gestation  - Risks associated with twin gestation were reviewed with the patient to include increased risk of preeclampsia, IUGR and preterm delivery  - Mono chorionic twins are increased risk of specific complications, including twin-twin transfusion syndrome, selective IUGR, severe perinatal morbidity and mortality after the death of co-twin. These risks were reviewed with the patient  - It appears that there is unequal sharing of the placenta between fetus A and B. Will re-evaluate by ultrasound in 3 weeks.   - Recommend limited ultrasound every 2 weeks beginning at 16 weeks to measure maximum vertical pocket and assess for fetal bladders, with evaluation of fetal growth at 4 week intervals  - There is an increased risk of cardiac anomalies in [facility], and screening for congenital heart disease is recommended by fetal echocardiogram around 20-22 weeks  - Delivery timing: women with uncomplicated monochorionic-diamniotic twin gestations can undergo delivery between 34 wk and 5540w6d    Membraneous Cord Insertion Fetus B  - There is an increased risk of fetal growth restriction and discordant growth   - Will serially follow fetal growth    Normocytic Anemia  - Possible blood loss anemia vs early iron deficiency anemia  - Reticulocyte count, ferritin, iron, TIBC ordered    AMA  - Pt desires genetic testing  - Cell free DNA is not recommended in patients with multiple gestations  - Discussed NT, AFP and possible quad screen with patient. Will proceed with nuchal translucency  measurement  and AFP at 15 weeks    First Trimester Bleeding  - Resolved    Tobacco Use in Pregnancy  - Pt smoking 1 ppd  - Counseled regarding increased risk of placental malfunction, preterm labor, fetal growth restriction and PPROM  - Pt encouraged to cut back. Discussed use of nicotine replacement. Pt declines at this time.     History of Substance Use  - Pt currently using 1 pain pill (Vicodin or Percocet) per day  - Expressed interest in quitting. Plans to investigate insurance coverage of cognitive behavioral therapy. Pt previously worked at [facility] [employer] and is aware of local resources      RTC in 3 weeks for [facility] appt and ultrasound to assess growth and nuchal translucencies.     Darlyn Read, MD 10/09/2015, 11:39  PGY-2  [facility]  Department of Obstetrics & Gynecology      I saw and examined the patient.  I reviewed the resident's note.  I agree with the findings and plan of care as documented in the resident's note.  Any exceptions/additions are edited/noted.    Burley Saver, MD

## 2015-10-14 NOTE — Progress Notes (Signed)
I called and spoke with pt's boyfriend, she was not available. He gave me her cell number. I called that and was unable to leave a message as her voicemail was not set up. No other number on file to call.   Moss McSarah C Keasha Malkiewicz, RN  10/14/2015, 16:06

## 2015-10-31 ENCOUNTER — Ambulatory Visit (INDEPENDENT_AMBULATORY_CARE_PROVIDER_SITE_OTHER): Payer: PPO

## 2015-10-31 ENCOUNTER — Other Ambulatory Visit (INDEPENDENT_AMBULATORY_CARE_PROVIDER_SITE_OTHER): Payer: Self-pay | Admitting: Obstetrics & Gynecology

## 2015-10-31 ENCOUNTER — Ambulatory Visit: Payer: PPO | Attending: Maternal & Fetal Medicine

## 2015-10-31 ENCOUNTER — Telehealth (HOSPITAL_COMMUNITY): Payer: Self-pay | Admitting: Obstetrics & Gynecology

## 2015-10-31 ENCOUNTER — Encounter (INDEPENDENT_AMBULATORY_CARE_PROVIDER_SITE_OTHER): Payer: Self-pay | Admitting: Family

## 2015-10-31 VITALS — BP 94/56 | Ht 64.0 in | Wt 113.5 lb

## 2015-10-31 DIAGNOSIS — O021 Missed abortion: Secondary | ICD-10-CM | POA: Insufficient documentation

## 2015-10-31 DIAGNOSIS — O0990 Supervision of high risk pregnancy, unspecified, unspecified trimester: Secondary | ICD-10-CM

## 2015-10-31 DIAGNOSIS — O30001 Twin pregnancy, unspecified number of placenta and unspecified number of amniotic sacs, first trimester: Secondary | ICD-10-CM

## 2015-10-31 NOTE — Progress Notes (Signed)
Called by Dr. Suszanne ConnersHolls this morning regarding embryonic demise of mono-di twins diagnosed this morning on US.  CRL measuring ~4277w5d weeks and ~269w5d at 2537w5d by early US.  Discussed options for management with patient. She desires to schedule suction D&C tomorrow morning. Written informed consent obtained for the procedure, scheduled for 11 am. All questions answered.  Support and reassurance given to patient. Supportive care team called and will see patient tomorrow.  Patient desires chromosome testing.  Plan to send FISH and karyotype with reflex microarray.  1 prior SAB, no indication for APLAS eval.    Vanessa KickSarah Korbyn Chopin, MD  10/31/2015, 09:36

## 2015-11-01 ENCOUNTER — Encounter (HOSPITAL_COMMUNITY): Payer: Self-pay

## 2015-11-01 ENCOUNTER — Ambulatory Visit (HOSPITAL_COMMUNITY): Payer: PPO | Admitting: Certified Registered"

## 2015-11-01 ENCOUNTER — Ambulatory Visit (HOSPITAL_BASED_OUTPATIENT_CLINIC_OR_DEPARTMENT_OTHER): Payer: PPO | Admitting: Certified Registered"

## 2015-11-01 ENCOUNTER — Encounter (HOSPITAL_COMMUNITY)
Admission: RE | Disposition: A | Discharge status: Home / Self Care | Payer: Self-pay | Source: Ambulatory Visit | Attending: Obstetrics & Gynecology

## 2015-11-01 ENCOUNTER — Inpatient Hospital Stay
Admission: RE | Admit: 2015-11-01 | Discharge: 2015-11-01 | Disposition: A | Discharge status: Home / Self Care | Payer: PPO | Source: Ambulatory Visit | Attending: Obstetrics & Gynecology | Admitting: Obstetrics & Gynecology

## 2015-11-01 ENCOUNTER — Ambulatory Visit (HOSPITAL_BASED_OUTPATIENT_CLINIC_OR_DEPARTMENT_OTHER): Payer: PPO | Admitting: Obstetrics & Gynecology

## 2015-11-01 DIAGNOSIS — O021 Missed abortion: Secondary | ICD-10-CM | POA: Insufficient documentation

## 2015-11-01 DIAGNOSIS — O364XX2 Maternal care for intrauterine death, fetus 2: Secondary | ICD-10-CM

## 2015-11-01 DIAGNOSIS — O034 Incomplete spontaneous abortion without complication: Secondary | ICD-10-CM

## 2015-11-01 DIAGNOSIS — O364XX1 Maternal care for intrauterine death, fetus 1: Secondary | ICD-10-CM

## 2015-11-01 DIAGNOSIS — F17210 Nicotine dependence, cigarettes, uncomplicated: Secondary | ICD-10-CM | POA: Insufficient documentation

## 2015-11-01 LAB — DRUG SCREEN, NO CONFIRMATION, URINE
AMPHETAMINES URINE: NEGATIVE
BARBITURATES URINE: NEGATIVE
BENZODIAZEPINES URINE: NEGATIVE
BUPRENORPHINE URINE: POSITIVE — AB
CANNABINOIDS URINE: NEGATIVE
COCAINE METABOLITES URINE: NEGATIVE
CREATININE RANDOM URINE: 92 mg/dL
ECSTASY/MDMA URINE: NEGATIVE
METHADONE URINE: NEGATIVE
OPIATES URINE (LOW CUTOFF): POSITIVE — AB
OXYCODONE URINE: POSITIVE — AB

## 2015-11-01 LAB — TYPE AND SCREEN
ABO/RH(D): B POS
ANTIBODY SCREEN: NEGATIVE

## 2015-11-01 LAB — CBC
HCT: 31 % — ABNORMAL LOW (ref 33.5–45.2)
HGB: 10.7 g/dL — ABNORMAL LOW (ref 11.2–15.2)
MCH: 32.8 pg (ref 27.4–33.0)
MCHC: 34.4 g/dL (ref 32.5–35.8)
MCV: 95.2 fL (ref 78.0–100.0)
MPV: 9.6 fL (ref 7.5–11.5)
PLATELETS: 197 x10ˆ3/uL (ref 140–450)
RBC: 3.25 x10ˆ6/uL — ABNORMAL LOW (ref 3.63–4.92)
RDW: 12.4 % (ref 12.0–15.0)
WBC: 6.2 x10ˆ3/uL (ref 3.5–11.0)

## 2015-11-01 SURGERY — DILATION AND SUCTION CURETTAGE
Anesthesia: Monitor Anesthesia Care | Site: Vagina

## 2015-11-01 MED ORDER — DOXYCYCLINE MONOHYDRATE 100 MG TABLET
200.0000 mg | ORAL_TABLET | Freq: Once | ORAL | Status: AC
Start: 2015-11-01 — End: 2015-11-01
  Administered 2015-11-01: 200 mg via ORAL
  Filled 2015-11-01: qty 2

## 2015-11-01 MED ORDER — IBUPROFEN 600 MG TABLET
600.00 mg | ORAL_TABLET | Freq: Four times a day (QID) | ORAL | 2 refills | Status: DC | PRN
Start: 2015-11-01 — End: 2018-02-13

## 2015-11-01 MED ORDER — SODIUM CHLORIDE 0.9 % (FLUSH) INJECTION SYRINGE
2.0000 mL | INJECTION | Freq: Three times a day (TID) | INTRAMUSCULAR | Status: DC
Start: 2015-11-01 — End: 2015-11-01
  Administered 2015-11-01: 0 mL

## 2015-11-01 MED ORDER — KETOROLAC 30 MG/ML (1 ML) INJECTION SOLUTION
30.0000 mg | Freq: Once | INTRAMUSCULAR | Status: DC
Start: 2015-11-01 — End: 2015-11-01

## 2015-11-01 MED ORDER — METHYLERGONOVINE 0.2 MG TABLET
0.20 mg | ORAL_TABLET | Freq: Four times a day (QID) | ORAL | Status: DC
Start: 2015-11-01 — End: 2015-11-01
  Administered 2015-11-01: 0.2 mg via ORAL
  Filled 2015-11-01: qty 1

## 2015-11-01 MED ORDER — KETOROLAC 30 MG/ML (1 ML) INJECTION SOLUTION
30.0000 mg | Freq: Once | INTRAMUSCULAR | Status: AC
Start: 2015-11-01 — End: 2015-11-01
  Administered 2015-11-01: 30 mg via INTRAVENOUS
  Filled 2015-11-01: qty 1

## 2015-11-01 MED ORDER — SODIUM CHLORIDE 0.9 % (FLUSH) INJECTION SYRINGE
2.0000 mL | INJECTION | INTRAMUSCULAR | Status: DC | PRN
Start: 2015-11-01 — End: 2015-11-01

## 2015-11-01 MED ORDER — MISOPROSTOL 200 MCG TABLET
ORAL_TABLET | ORAL | Status: AC
Start: 2015-11-01 — End: 2015-11-01
  Administered 2015-11-01: 600 ug via VAGINAL
  Filled 2015-11-01: qty 3

## 2015-11-01 MED ORDER — MIDAZOLAM 1 MG/ML INJECTION SOLUTION
Freq: Once | INTRAMUSCULAR | Status: DC | PRN
Start: 2015-11-01 — End: 2015-11-01
  Administered 2015-11-01: 2 mg via INTRAVENOUS

## 2015-11-01 MED ORDER — PROPOFOL 10 MG/ML INTRAVENOUS EMULSION
INTRAVENOUS | Status: DC | PRN
Start: 2015-11-01 — End: 2015-11-01
  Administered 2015-11-01: 0 ug/kg/min via INTRAVENOUS
  Administered 2015-11-01: 250 ug/kg/min via INTRAVENOUS
  Administered 2015-11-01: 200 ug/kg/min via INTRAVENOUS

## 2015-11-01 MED ORDER — LACTATED RINGERS INTRAVENOUS SOLUTION
INTRAVENOUS | Status: DC
Start: 2015-11-01 — End: 2015-11-01

## 2015-11-01 MED ORDER — METHYLERGONOVINE 0.2 MG TABLET
0.20 mg | ORAL_TABLET | Freq: Four times a day (QID) | ORAL | Status: DC
Start: 2015-11-01 — End: 2015-11-01

## 2015-11-01 MED ORDER — DOXYCYCLINE MONOHYDRATE 100 MG TABLET
100.0000 mg | ORAL_TABLET | Freq: Once | ORAL | Status: AC
Start: 2015-11-01 — End: 2015-11-01
  Administered 2015-11-01: 100 mg via ORAL
  Filled 2015-11-01: qty 1

## 2015-11-01 MED ORDER — MISOPROSTOL 200 MCG TABLET
600.00 ug | ORAL_TABLET | ORAL | Status: AC
Start: 2015-11-01 — End: 2015-11-01

## 2015-11-01 MED ORDER — METHYLERGONOVINE 0.2 MG TABLET
0.20 mg | ORAL_TABLET | Freq: Four times a day (QID) | ORAL | 0 refills | Status: DC
Start: 2015-11-01 — End: 2015-11-15

## 2015-11-01 MED ORDER — ACETAMINOPHEN 325 MG TABLET
975.0000 mg | ORAL_TABLET | Freq: Once | ORAL | Status: AC
Start: 2015-11-01 — End: 2015-11-01
  Administered 2015-11-01: 975 mg via ORAL
  Filled 2015-11-01: qty 3

## 2015-11-01 MED ADMIN — lactated Ringers intravenous solution: INTRAVENOUS | @ 10:00:00

## 2015-11-01 SURGICAL SUPPLY — 28 items
ADHESIVE TISSUE EXOFIN 1.0ML_PREMIERPRO EXOFIN (SEALANTS)
CAN SUCT BTL SEAL CAP TISS TRP_LID BRKLY SAFETOUCH DISP STRL (Suction) ×1
CAN SUCT CLS CNTN TISS TRP SEAL CAP BRKLY SAFETOUCH DISP (Suction) ×1 IMPLANT
CATH IU BAKRI 24FR 54CM 180CM BAL SYRG RPD INSTILLATION COM SIL 500ML 60ML STRL LF  DISP (MISCELLANEOUS PT CARE ITEMS) IMPLANT
CATH IU BAKRI 24FR 54CM 180CM_2 CK VLV BAL RPD INSTILLATION (MISCELLANEOUS PT CARE ITEMS)
CATH URETH DOVER RBNL 14FR 16IN Ã¡STGR DRAIN EYE RND CLS TIP INT FNL CONN PVC STRL LF  DISP (UROLOGICAL SUPPLIES) ×1 IMPLANT
CATH URETH DV RBNL 14FR 16IN 2_STGR DRAIN EYE RND CLS TIP (UROLOGICAL SUPPLIES) ×1
CONV USE ITEM 156524 - ADHESIVE TISSUE EXOFIN 1.0ML_PREMIERPRO EXOFIN (SEALANTS) IMPLANT
DISCONTINUED USE ITEM 307130 - GOWN SURG XL XLNG AAMI L4 IMPR_V BRTHBL HKLP CLSR STRL LF (PROTECTIVE PRODUCTS/GARMENTS) ×2
DISCONTINUED USE ITEM 330911 - SET TUBING 3/8IN 6FT SWVL HNDL SLIP RING MALE FIT BRKLY SAFETOUCH CRTG STRL DISP (IV TUBING & ACCESSORIES) ×1 IMPLANT
DISCONTINUED USE ITEM 342873 - GOWN SURG XL XLNG AAMI L4 IMPR_V BRTHBL HKLP CLSR STRL LF (PROTECTIVE PRODUCTS/GARMENTS) ×2 IMPLANT
GAUZE SURG 4X4IN RFDETECT RF A_SSURE COTTON 16 PLY XRY DTBL (WOUND CARE/ENTEROSTOMAL SUPPLY) ×1
GAUZE SURG 4X4IN STD RFDETECT COTTON 16 PLY XRY ABS LF  STRL DISP (WOUND CARE SUPPLY) ×1 IMPLANT
GOWN SURG XL XLNG AAMI L4 IMPR_V BRTHBL HKLP CLSR STRL LF (PROTECTIVE PRODUCTS/GARMENTS) ×2
HANDLE RIGID PLASTIC STRL LF  DISP DVN EZ HNDL SURG LIGHT (INSTRUMENTS) ×1
HANDLE RIGID PLASTIC STRL LF_DISP DVN EZ HNDL SURG LIGHT (INSTRUMENTS) ×1
HANDPC SUCT MEDIVAC YANKAUER BLBS TIP CLR STRL LF  DISP (Suction) ×1 IMPLANT
HANDPC SUCT MEDIVAC YANKAUER B_LBS TIP CLR STRL LF DISP (Suction) ×1
MBO USE ITEM 317672 - HANDLE RIGID PLASTIC STRL LF  DISP DVN EZ HNDL SURG LIGHT (SURGICAL INSTRUMENTS) ×1 IMPLANT
PACK SURG SIRUS LITH II UNDERBUTTOCK DRP REINF TBL CVR TAPE STRL 90X50IN 47X35IN LF (DRAPE/PACKS/SHEETS/OR TOWEL) ×1 IMPLANT
PACK SURG SIRUS LITH II UNDERB_UTTOCK DRP REINF TBL CVR TAPE (DRAPE/PACKS/SHEETS/OR TOWEL) ×1
SET TUBING 3/8IN 6FT SWVL HNDL_SLIP RING MALE FIT BRKLY (IV TUBING & ACCESSORIES) ×1
SYRINGE BD LL 10ML LF STRL CO_NTROL CONCEN TIP PRGN FREE (NEEDLES & SYRINGE SUPPLIES) ×1
SYRINGE LL 10ML LF  STRL CONTROL CONCEN TIP PRGN FREE DEHP-FR MED DISP (NEEDLES & SYRINGE SUPPLIES) ×1 IMPLANT
TRAY SKIN SCRUB 8IN VNYL COTTON 6 WNG 6 SPONGE STICK 2 TIP APPL DRY STRL LF (KITS & TRAYS (DISPOSABLE)) ×1 IMPLANT
TRAY SURG PREP SCR CR ESTM (KITS & TRAYS (DISPOSABLE)) ×1
TUBING SUCT CLR 20FT 9/32IN MEDIVAC NCDTV M/M CONN STRL LF (Suction) ×1 IMPLANT
TUBING SUCT CONN 20FT LONG_STRL N720A (Suction) ×1

## 2015-11-01 NOTE — Nurses Notes (Signed)
Patient complaining of pain with cramping.  Notified physicians about pain.  Orders received for acetaminophen. Asked physicians if patient could be discharged. Physicians stated yes.  Acetaminophen and doxycycline given.  Patient education given to patient.

## 2015-11-01 NOTE — Anesthesia Postprocedure Evaluation (Signed)
ANESTHESIA POSTOP EVALUATION NOTE         11/01/2015     Last Vitals: Temperature: 36.6 C (97.9 F) (11/01/15 1459)  Heart Rate: 73 (11/01/15 1645)  BP (Non-Invasive): 122/76 (11/01/15 1555)  Respiratory Rate: 18 (11/01/15 1555)  SpO2-1: 100 % (11/01/15 1600)  Pain Score (Numeric, Faces): 5 (11/01/15 1700)    Procedures:   DILATION AND SUCTION CURETTAGE (N/A Vagina )    Patient is sufficiently recovered from the effects of anesthesia to participate in the evaluation and has returned to their pre-procedure level.  I have reviewed and evaluated the following:  Respiratory Function: Consistent with pre anesthetic level  Cardiovascular Function: Consistent with pre anesthetic level  Mental Status: Return to pre anesthetic baseline level  Pain: Sufficiently controlled with medication  Nausea and Vomiting: Absent or sufficiently controlled with medication  Post-op Anesthetic Complications: None    Comment/ re-evaluation for any variations: None

## 2015-11-01 NOTE — Discharge Instructions (Signed)
Miscarriage  A miscarriage is the sudden loss of an unborn baby (fetus) before the 20th week of pregnancy. Most miscarriages happen in the first 3 months of pregnancy. Sometimes, it happens before a woman even knows she is pregnant. A miscarriage is also called a "spontaneous miscarriage" or "early pregnancy loss." Having a miscarriage can be an emotional experience. Talk with your caregiver about any questions you may have about miscarrying, the grieving process, and your future pregnancy plans.  CAUSES    Problems with the fetal chromosomes that make it impossible for the baby to develop normally. Problems with the baby's genes or chromosomes are most often the result of errors that occur, by chance, as the embryo divides and grows. The problems are not inherited from the parents.   Infection of the cervix or uterus.    Hormone problems.    Problems with the cervix, such as having an incompetent cervix. This is when the tissue in the cervix is not strong enough to hold the pregnancy.    Problems with the uterus, such as an abnormally shaped uterus, uterine fibroids, or congenital abnormalities.    Certain medical conditions.    Smoking, drinking alcohol, or taking illegal drugs.    Trauma.   Often, the cause of a miscarriage is unknown.   SYMPTOMS    Vaginal bleeding or spotting, with or without cramps or pain.   Pain or cramping in the abdomen or lower back.   Passing fluid, tissue, or blood clots from the vagina.  DIAGNOSIS   Your caregiver will perform a physical exam. You may also have an ultrasound to confirm the miscarriage. Blood or urine tests may also be ordered.  TREATMENT    Sometimes, treatment is not necessary if you naturally pass all the fetal tissue that was in the uterus. If some of the fetus or placenta remains in the body (incomplete miscarriage), tissue left behind may become infected and must be removed. Usually, a dilation and curettage (D and C) procedure is performed.  During a D and C procedure, the cervix is widened (dilated) and any remaining fetal or placental tissue is gently removed from the uterus.   Antibiotic medicines are prescribed if there is an infection. Other medicines may be given to reduce the size of the uterus (contract) if there is a lot of bleeding.   If you have Rh negative blood and your baby was Rh positive, you will need a Rh immunoglobulin shot. This shot will protect any future baby from having Rh blood problems in future pregnancies.  HOME CARE INSTRUCTIONS    Your caregiver may order bed rest or may allow you to continue light activity. Resume activity as directed by your caregiver.   Have someone help with home and family responsibilities during this time.    Keep track of the number of sanitary pads you use each day and how soaked (saturated) they are. Write down this information.    Do not use tampons. Do not douche or have sexual intercourse until approved by your caregiver.    Only take over-the-counter or prescription medicines for pain or discomfort as directed by your caregiver.    Do not take aspirin. Aspirin can cause bleeding.    Keep all follow-up appointments with your caregiver.    If you or your partner have problems with grieving, talk to your caregiver or seek counseling to help cope with the pregnancy loss. Allow enough time to grieve before trying to get pregnant again.     SEEK IMMEDIATE MEDICAL CARE IF:    You have severe cramps or pain in your back or abdomen.   You have a fever.   You pass large blood clots (walnut-sized or larger) ortissue from your vagina. Save any tissue for your caregiver to inspect.    Your bleeding increases.    You have a thick, bad-smelling vaginal discharge.   You become lightheaded, weak, or you faint.    You have chills.   MAKE SURE YOU:   Understand these instructions.   Will watch your condition.   Will get help right away if you are not doing well or get worse.     This  information is not intended to replace advice given to you by your health care provider. Make sure you discuss any questions you have with your health care provider.     Document Released: 12/16/2000 Document Revised: 10/17/2012 Document Reviewed: 08/11/2011  Elsevier Interactive Patient Education 2016 Elsevier Inc.    Dilation and Curettage or Vacuum Curettage, Care After  Refer to this sheet in the next few weeks. These instructions provide you with information on caring for yourself after your procedure. Your health care provider may also give you more specific instructions. Your treatment has been planned according to current medical practices, but problems sometimes occur. Call your health care provider if you have any problems or questions after your procedure.  WHAT TO EXPECT AFTER THE PROCEDURE  After your procedure, it is typical to have light cramping and bleeding. This may last for 2 days to 2 weeks after the procedure.  HOME CARE INSTRUCTIONS    Do not drive for 24 hours.   Wait 1 week before returning to strenuous activities.   Take your temperature 2 times a day for 4 days and write it down. Provide these temperatures to your health care provider if you develop a fever.   Avoid long periods of standing.   Avoid heavy lifting, pushing, or pulling. Do not lift anything heavier than 10 pounds (4.5 kg).   Limit stair climbing to once or twice a day.   Take rest periods often.   You may resume your usual diet.   Drink enough fluids to keep your urine clear or pale yellow.   Your usual bowel function should return. If you have constipation, you may:    Take a mild laxative with permission from your health care provider.    Add fruit and bran to your diet.    Drink more fluids.   Take showers instead of baths until your health care provider gives you permission to take baths.   Do not go swimming or use a hot tub until your health care provider approves.   Try to have someone with you or  available to you the first 24-48 hours, especially if you were given a general anesthetic.   Do not douche, use tampons, or have sex (intercourse) for 2 weeks after the procedure.   Only take over-the-counter or prescription medicines as directed by your health care provider. Do not take aspirin. It can cause bleeding.   Follow up with your health care provider as directed.  SEEK MEDICAL CARE IF:    You have increasing cramps or pain that is not relieved with medicine.   You have abdominal pain that does not seem to be related to the same area of earlier cramping and pain.   You have bad smelling vaginal discharge.   You have a rash.   You  are having problems with any medicine.  SEEK IMMEDIATE MEDICAL CARE IF:    You have bleeding that is heavier than a normal menstrual period.   You have a fever.   You have chest pain.   You have shortness of breath.   You feel dizzy or feel like fainting.   You pass out.   You have pain in your shoulder strap area.   You have heavy vaginal bleeding with or without blood clots.  MAKE SURE YOU:    Understand these instructions.   Will watch your condition.   Will get help right away if you are not doing well or get worse.     This information is not intended to replace advice given to you by your health care provider. Make sure you discuss any questions you have with your health care provider.     Document Released: 06/19/2000 Document Revised: 06/27/2013 Document Reviewed: 01/19/2013  Elsevier Interactive Patient Education [employer]2016 Elsevier Inc.

## 2015-11-01 NOTE — Care Plan (Signed)
Problem: General Plan of Care(Adult,OB)  Goal: Plan of Care Review(Adult,OB)  The patient and/or their representative will communicate an understanding of their plan of care  Outcome: Ongoing (see interventions/notes)  Patient Name:  Chelsea GaleaJamie Marie Mccall  Date of Encounter:   11/01/2015     Other Pertinent Information: I responded to referral from RN Christy for support for patient and her partner, Jonny RuizJohn, prior to patient's D&C.  Patient was occasionally tearful, and said she had "cried most of the day yesterday", and now was exhausted and didn't want to cry anymore.  John shared that he has been grieving the loss of several family members and friends who have died over the past year, and losing their babies brings all of that back to him.  When asked about sources of strength, patient said her family, whom she will see this weekend at a [place] in [place], and the prayers of her father's church community ([employer] in [place]  Patient ended the visit, saying she would like to rest.  Asher MuirJamie and Jonny RuizJohn appreciate prayer, and I told them I would keep them in mine.        Karen KitchensMoira Shabree Tebbetts, CHAPLAIN RESIDENT  Pager: [phone]  Total Time of Encounter: 30 min.

## 2015-11-01 NOTE — Progress Notes (Addendum)
Patient left prior to receiving prescriptions and discharge instructions. Spoke with patient over the phone to discuss post-operative precautions. Sent motrin 600 mg and Methergin 0.2 mg Q6 hr x 24 hr total to local pharmacy. Patient instructed to follow up with Dr. Loistine Chanceotson in 2 weeks. Patient voiced understanding.     Marlana SalvageMelissa A Carr, MD 11/01/2015 18:59  PGY-1  [facility]   Department of Obstetrics & Gynecology      I reviewed the resident's note.  I agree with the findings and plan of care as documented in the resident's note.  Any exceptions/additions are edited/noted.    Vanessa KickSarah Edgar Reisz, MD

## 2015-11-01 NOTE — Ancillary Notes (Signed)
11/01/15 1030   Clinical Encounter Type   Reason for Visit Supportive Care   Referral From RN Christy   Declined Spiritual Care Visit At This Time No   Visited With Patient;Significant Other   Patient Spiritual Encounters   Spiritual Assessment Grief   Interventions Encouraged self care;Facilitated grief;Offered empathy;Provided supportive presence   Outcomes Spiritual Care relationship established;Patient processed emotions;Patient connected to spiritual support;Patient coping more effectively   Patient's Goals/Hopes spend time with family this weekend   Other Support for Patient SO is here   How far is the patient from home?  [facility]   Family Spiritual Encounters   Spiritual Assessment Grief   Interventions Facilitated grief;Provided supportive presence;Offered empathy   Outcomes Spiritual Care relationship established;Family shared/processed patient's story;Family connected to spiritual support   How has patient's hospitalization affected family? cumulative grief; SO processing deaths of family members over the last year   [facility]  Spiritual Care Note    Patient Name:  Chelsea GaleaJamie Marie Loeffler  Date of Encounter:   11/01/2015    Other Pertinent Information: I responded to referral from RN Christy for support for patient and her partner, Jonny RuizJohn, prior to patient's D&C.  Patient was occasionally tearful, and said she had "cried most of the day yesterday", and now was exhausted and didn't want to cry anymore.  John shared that he has been grieving the loss of several family members and friends who have died over the past year, and losing their babies brings all of that back to him.  When asked about sources of strength, patient said her family, whom she will see this weekend at [place], and the prayers of her father's church community ([employer] in [place]  Patient ended the visit, saying she would like to rest.  Asher MuirJamie and Jonny RuizJohn appreciate prayer, and I told them I would  keep them in mine.      Karen KitchensMoira Gregoria Selvy, CHAPLAIN RESIDENT  Pager: [phone]  Total Time of Encounter: 30 min.

## 2015-11-01 NOTE — Anesthesia Transfer of Care (Signed)
ANESTHESIA TRANSFER OF CARE NOTE                Last Vitals: Temperature: 36.6 C (97.9 F) (11/01/15 1459)  Heart Rate: 85 (11/01/15 1005)  BP (Non-Invasive): (!) 98/56 (11/01/15 1005)  Respiratory Rate: 18 (11/01/15 1050)  Pain Score (Numeric, Faces): 0 (11/01/15 1050)    Patient transferred to Triage 5 in stable condition. Report given to RN.    4/28/2017at 15:02.    Princella IonLaura Maria Adonis Yim, MD

## 2015-11-01 NOTE — Anesthesia Preprocedure Evaluation (Signed)
Physical Exam:     Airway       Mallampati: II    TM distance: >3 FB    Neck ROM: full  Mouth Opening: good.  No Facial hair  No Beard  No endotracheal tube present  No Tracheostomy present    Dental       Dentition intact             Pulmonary    Breath sounds clear to auscultation       Cardiovascular    Rhythm: regular  Rate: Normal       Other findings            Anesthesia Plan:  Planned anesthesia type: MAC  ASA 2     Intravenous induction   Anesthetic plan and risks discussed with patient.     Anesthesia issues/risks discussed are: PONV, Blood Loss, Post-op Intubation/Ventilation, Intraoperative Awareness/ Recall, Aspiration, Sore Throat and Post-op Pain Management.    Use of blood products discussed with patient whom consented to blood products.     Patient's NPO status is appropriate for Anesthesia.         (Had water this morning at 8. No solids since last  Night. )        Princella IonLaura Maria Read Bonelli, MD

## 2015-11-01 NOTE — H&P (Addendum)
[place] Department of Obstetrics & Gynecology      HISTORY AND PHYSICAL     PATIENT: Chelsea Mccall  CHART NUMBER: [id number]  DATE OF SERVICE: 11/01/2015      PRIMARY OB: Dotson      CC: missed Ab     HPI: Chelsea Mccall is a 40 y.o. G4P0030  At [redacted]w[redacted]d who presents to labor and delivery for scheduled D&C for embryonic demise of mono di twin gestation measuring [redacted]w[redacted]d and [redacted]w[redacted]d. Pt is without complaints.  She denies contractions, VB and LOF.         Dating:  Dating Summary     Working EDD: 05/02/16 set by Vanessa Kick, MD on 09/13/15 based on Ultrasound on 09/13/15       Based On EDD GA Dif GA User Date    Last Menstrual Period on 07/02/15 (Approximate) 04/07/16 +[redacted]w[redacted]d  Vanessa Kick, MD 09/13/15    Ultrasound on 09/13/15 05/02/16 Working [redacted]w[redacted]d Vanessa Kick, MD 09/13/15           OB History:  Lab Results   Component Value Date    ABORHD B POSITIVE 10/01/2015    HGB 10.7 (L) 11/01/2015    HGB 10.9 (L) 10/15/2014    HCT 31.0 (L) 11/01/2015    HCT 33.5 10/15/2014       HIV: Negative        VDRL:  Non reactive        Hep B SAG:  Negative         GBS:  unknwon        Rubella: immune       GC:  Not detected   Chlamydia: Not detected     OB History   Gravida Para Term Preterm AB SAB TAB Ectopic Multiple Living   # Outcome Date GA Lbr Len/2nd Weight Sex Delivery Anes PTL Lv   4 Current            3 SAB 2016           2 TAB      TAB      1 TAB      TAB             PAST MEDICAL HISTORY:  Past Medical History:   Diagnosis Date    Kidney stone     Migraine            PAST SURGICAL HISTORY:  Past Surgical History:   Procedure Laterality Date    HX APPENDECTOMY      HX CHOLECYSTECTOMY      HX WISDOM TEETH EXTRACTION             FAMILY HISTORY:       Family History   Problem Relation Age of Onset    Breast Cancer Maternal Grandmother     Breast Cancer Paternal Grandmother     Bleeding Prob Neg Hx     Congenital Defect Neg Hx            SOCIAL HISTORY:    Social History   Substance Use Topics     Smoking status: Current Every Day Smoker     Packs/day: 1.00     Types: Cigarettes    Smokeless tobacco: Never Used    Alcohol use No        CURRENT MEDICATIONS:   None    ALLERGIES:  Naproxen  REVIEW OF SYSTEMS: Other than ROS in the HPI, all other systems were negative.    PHYSICAL EXAMINATION:   Filed Vitals:    11/01/15 1005 11/01/15 1050   BP: (!) 98/56    Pulse: 85    Resp:  18   Temp: 37 C (98.6 F)        General: appears in good health  Lungs: Clear to auscultation bilaterally.   Cardiovascular: regular rate and rhythm  Abdomen: gravid, soft, non tender  Extremities: No cyanosis or edema          ASSESSMENT/PLAN: 40 y.o. G4P0030 at 5467w6d    1.Demise of Mono Di Twin gestation   Plan to proceed with scheduled D&C.   R/B/A discussed, she expressed understanding, pt provided opportunity to ask questions and all questions answered. Surgical consent obtained previously by Dr. Loistine Chanceotson   Pt drank water all morning, will hold procedure per anesthesia recommendations   Pt desires genetic testing - will send for karyotype and karyotype with reflex microarray.  - 600mcg  misoprostol PV      Pt seen and examined with Dr. Darcel BayleyLeonard at bedside  Thurnell Garbehristina Lyn Cox, MD 11/01/2015, 11:23  PGY-4  [facility]  Department of Obst[facility]s & [facility]necology          I saw and examined the patient.  I reviewed the resident's note.  I agree with the findings and plan of care as documented in the resident's note.  Any exceptions/additions are edited/noted.    Heron Nayassie Yoanna Jurczyk, MD

## 2015-11-01 NOTE — Nurses Notes (Signed)
Patient education done about after care.  Patient is stable and to lobby.  Patient didn't wait for transport

## 2015-11-01 NOTE — OR Surgeon (Addendum)
[place] Department of Obstetrics and Gynecology                            OPERATIVE NOTE    [facility]   Hospital Number: [id number]   Date of Service: 11/01/2015   Date of Birth: 09/16/1975       Pre-Operative Diagnosis: 40 y.o. G4P0030 with demise of twin gestation measuring 2260w5d and 4249w5d  Post-Operative Diagnosis: Same  Procedure(s)/Description:  Exam under anesthesia & Suction dilation and curettage     Findings: anteverted uterus approximately 14 weeks in size, Large amounts of products of conception without gross villous swelling, including 1 intact fetus    Attending Surgeon: Heron Nayassie Lavone Barrientes, MD   Assistant(s): Jethro Bolushristina Cox Dima Mini, MD PGY-4    Anesthesia Type: Monitored Anesthesia Care (MAC)  Estimated Blood Loss:  50cc   Blood Given: None  Fluids Given: 500 Cyrstalloid    Complications:  None  Wound Class: Clean Contaminated Wounds -Respiratory, GI, Genital, or Urinary  Tubes: None  Drains: None  Specimens/ Cultures: Products of conception  Implants: None    DVT Prophalaxis:  SCDs/ Venodynes  Pre-Op Medications/Antibiotics:  Doxycycline 100mg  IV        Disposition: PACU - hemodynamically stable.  Condition: stable    Description of Procedure: After informed consent was obtained, the patient was taken to the operating suite adequate anesthesia was obtained. She was positioned in the lithotomy position in the Allen-type stirrups. Care was taken during positioning to avoid excessive abduction, external rotation or flexion of the hips and avoid peroneal nerve pressure. The perineum, inner thighs and vagina were prepped with povidone iodine and sterile drapes were placed. The bladder was noted to be empty on ultrasound. An exam under anesthesia was performed with the above noted findings. A surgical pause was performed between all members of the surgical team and all were in agreement with the patient's identity and procedure to be performed. Doxycycline 100 mg had been administered  intravenously within 1 hour of the procedure.   A weighted speculum was placed in the vagina and the anterior lip of the cervix was grasped with a single tooth tenaculuum. The cervix was serially dilated to a 29 Pratt dilator. The 10-mm suction curette was advanced into the uterus. Suction was activated to 50 cm of mercury and the uterine contents were removed with curettage. Several passes of the suction were performed until no further return of tissue occurred. A sharp curettage with a uterine curette was then gently performed until to confirm absence of any remaining products of conception in all 4 quadrants of the uterus. The tennaculum was removed from the cervix and the speculum was removed from the vagina. The patient was then awakened and transferred to the recovery room in good condition. There were no complications. Counts were correct at the end of the procedure.     Dr. Darcel BayleyLeonard was present and participated in the entire procedure.       Thurnell Garbehristina Lyn Cox, MD 11/01/2015, 15:12   PGY-4  [facility] [place]  Department of Obstetrics & Gynecology        I was present for and participated all key and/or critical portions of the case and immediately available at all times.      Heron Nayassie Teriann Livingood, MD 11/02/2015, 12:21

## 2015-11-01 NOTE — Consults (Signed)
[facility]  [facility] Supportive Care Nurse - Initial Note      Patient name: Chelsea GaleaJamie Marie Rocque  Date of Service: 11/01/2015  Date of Birth: 02/13/1976  Hospital Day:  LOS: 0 days   Admitting Diagnosis: missed ab twins    Reason for Consult: Consult for family support.   Thank you for this consult.    Summary of Visit: Per H&P:  Chelsea Mccall is a 40 y.o. G4P0030 At 6874w6d who presents to labor and delivery for scheduled D&C for embryonic demise of mono di twin gestation measuring 353w5d and 843w5d.      Updated per primary team.  Intake visit this morning with Chelsea Mccall and her partner Chelsea [facility]  Established relationship with Supportive Care Team.  She noted that she "cried everything out last evening" and didn't feel she had many "tears left". Validated and normalized her emotions. She noted good family/friend support systems.  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  She noted good communication from the primary team.  Discussions regarding no fault of uncontrollable situations.  Discussions regarding grieving as individuals and as a couple.  John was currently sleeping in the chair, as Chelsea Mccall noted them to not have had much sleep over the past couple days.  Contact information given. Support given. Will continue to follow.    Time of Encounter:  40 minutes    Sophronia Simashristy W Madylyn Insco, RN

## 2015-11-04 ENCOUNTER — Ambulatory Visit (HOSPITAL_BASED_OUTPATIENT_CLINIC_OR_DEPARTMENT_OTHER): Payer: Self-pay | Admitting: Obstetrics & Gynecology

## 2015-11-04 NOTE — Telephone Encounter (Addendum)
Please advise. [name], RN  11/04/2015, 16:11  Regarding: Message from NP at [facility]  ----- Message from Chelsea Mccall sent at 11/04/2015  3:56 PM EDT -----  Chelsea Mccall Pt    Pt is s/p D&C on 04.28.17.  Attempted to call triage nurse x2.  Nurse Practitioner wanted to speak with Dr. Darcel Mccall but she was in a room.  Requested I take the following message:    [facility] went to [facility] with bilateral leg swelling.  Has a mild UTI.  No DVT.  H&H was 10 and 30.  U/s revealed no retained uterine products.  He was recommending that the pt be seen tomorrow for f/u.  Please advise.    Thank you    Chelsea Mccall

## 2015-11-04 NOTE — Telephone Encounter (Signed)
Pt called complaining of leg swelling that is severe. She also states that she is not feeling well at all. Pt advised to got to ED to be evaluated. Pt voiced understanding. Moss McSarah C Harveen Flesch, RN  11/04/2015, 11:05

## 2015-11-05 LAB — HISTORICAL SURGICAL PATHOLOGY SPECIMEN

## 2015-11-05 NOTE — Telephone Encounter (Addendum)
Please advise. Chelsea McSarah C Danon Lograsso, RN  11/05/2015, 08:31  Regarding: Message from NP at [facility]  ----- Message from Holly BodilyShirley Baker sent at 11/05/2015  8:03 AM EDT -----  Dr Darcel BayleyLeonard pt     The patient called back asking that you call her at [phone] to let her know when she is supposed to come in today.   Thank you    ----- Message from Nigel Bridgemanynthia Jo Hayes sent at 11/04/2015  3:56 PM EDT -----  Darcel BayleyLeonard Pt    Pt is s/p D&C on 04.28.17.  Attempted to call triage nurse x2.  Nurse Practitioner wanted to speak with Dr. Darcel BayleyLeonard but she was in a room.  Requested I take the following message:    Pt went to [facility] with bilateral leg swelling.  Has a mild UTI.  No DVT.  H&H was 10 and 30.  U/s revealed no retained uterine products.  He was recommending that the pt be seen tomorrow for f/u.  Please advise.    Thank you    Arline Aspindy

## 2015-11-15 ENCOUNTER — Encounter (HOSPITAL_BASED_OUTPATIENT_CLINIC_OR_DEPARTMENT_OTHER): Payer: Self-pay | Admitting: Obstetrics & Gynecology

## 2015-11-15 ENCOUNTER — Ambulatory Visit: Payer: PPO | Attending: Obstetrics & Gynecology | Admitting: Obstetrics & Gynecology

## 2015-11-15 VITALS — BP 114/70 | Temp 97.9°F | Ht 64.0 in | Wt 119.5 lb

## 2015-11-15 DIAGNOSIS — O021 Missed abortion: Secondary | ICD-10-CM | POA: Insufficient documentation

## 2015-11-15 DIAGNOSIS — Z09 Encounter for follow-up examination after completed treatment for conditions other than malignant neoplasm: Secondary | ICD-10-CM | POA: Insufficient documentation

## 2015-11-15 DIAGNOSIS — Z7189 Other specified counseling: Secondary | ICD-10-CM

## 2015-11-15 NOTE — Progress Notes (Signed)
[place] Department of Obstetric & Gynecology      GYN FOLLOW-UP VISIT    PATIENT: Chelsea Mccall  CHART NUMBER: [id number]  DATE OF SERVICE: 11/15/2015      CC: post-op D&C    S: Chelsea Mccall is a 40 y.o. G4P0030 who presents for follow-up of suction D&C 2 weeks ago for missed abortion of twin pregnancy.  She reports she did well after the procedure.  Minimal bleeding. She had issues with pain and swelling in the legs and went to the ED where they did a pelvic US and also US of her leg, no DVT.  She is struggling emotionally with the loss, but reports she has good support. She has been drinking alcohol several nights this week to help coping, but has avoiding taking pain pills to consciously avoid relapse of her addiction. She plans to seek counseling through [facility] and also would like to see Holland FallingLiz Cohen. She feels like returning to work next week will help her with emotional recovery.  Today she denies bleeding, cramping, pain, fever, chills, nausea, vomiting. She does not desire contraception at this time and would be happy to get pregnant again.  She is continuing to take prenatal vitamins.    O:   BP 114/70   Temp 36.6 C (97.9 F)   Ht 1.626 m (5\' 4" )   Wt 54.2 kg (119 lb 7.8 oz)   LMP 07/02/2015 (Approximate)   Breastfeeding? No   BMI 20.51 kg/m2  General: alert, pleasant, in no acute distress  Abd: soft, nontender, nondistended  Pelvic:  Normal external genitalia  Vagina: well-estrogenized, no lesions, no discharge  Cervix: no lesions, closed  Uterus: anteverted, nontender  Adnexa: nontender, no masses  Ext: symmetric, no edema  Neuro: grossly normal    ASSESSMENT/PLAN: 40 y.o. G4P0030 for follow-up of suction D&C for missed abortion of twins. Normal exam.    - reassurance given to patient about normal grieving process  - encouraged to make counseling appt through [facility]  - referral to [facility] for additional counseling  - continue prenatal vitamins   - call if no menses 8 weeks after  procedure    Follow up PRN.    Vanessa KickSarah Jewell Ryans, MD 11/15/2015, 15:45  [facility] [place]  Department of Obstetrics & Gynecology

## 2015-11-18 ENCOUNTER — Telehealth (INDEPENDENT_AMBULATORY_CARE_PROVIDER_SITE_OTHER): Payer: Self-pay | Admitting: Clinical

## 2015-11-18 NOTE — Telephone Encounter (Signed)
Pt referred by S. Dotson following SAB of twin gestation. Pt with h/o narcotic abuse. T/c to pt to discuss mental health services and to schedule appt. Pt states she returned to work today, so her schedule is fairly limited. Added her to my schedule on 12/17/15 at 5:00, her first time available.

## 2015-11-19 ENCOUNTER — Encounter (HOSPITAL_BASED_OUTPATIENT_CLINIC_OR_DEPARTMENT_OTHER): Payer: Self-pay

## 2015-11-19 ENCOUNTER — Other Ambulatory Visit (HOSPITAL_BASED_OUTPATIENT_CLINIC_OR_DEPARTMENT_OTHER): Payer: Self-pay | Admitting: Obstetrics & Gynecology

## 2015-11-28 LAB — HISTORICAL CYTOGENETICS, KARYOTYPE AND FISH, NON BLOOD

## 2015-12-17 ENCOUNTER — Ambulatory Visit (INDEPENDENT_AMBULATORY_CARE_PROVIDER_SITE_OTHER): Payer: PPO | Admitting: Clinical

## 2016-03-07 ENCOUNTER — Other Ambulatory Visit: Payer: Self-pay

## 2017-04-10 ENCOUNTER — Other Ambulatory Visit: Payer: Self-pay

## 2017-07-06 DIAGNOSIS — R87619 Unspecified abnormal cytological findings in specimens from cervix uteri: Secondary | ICD-10-CM

## 2017-07-06 HISTORY — DX: Unspecified abnormal cytological findings in specimens from cervix uteri: R87.619

## 2017-07-06 NOTE — L&D Delivery Note (Signed)
[facility]    Delivery Summary Note      Name: Elpidio GaleaJamie Marie Lahaie   MRN: [id number]  DOB:  03/29/1976  Admitted: 02/13/2018  5:48 AM       Pre-delivery diagnosis:    1. 42 y.o. G4P0030 at 4249w5d gestation.   2. PPROM  3. Chorioamnionitis  4. Hepatitis C  5. Opioid dependence  6. Anemia  7. Tobacco abuse  8. Bacterial vaginosis  9. Urinary tract infection    Post-delivery diagnosis:    1. Same plus delivery of non-viable fetus    Findings:         Patient feeling more pressure. Assessed and found to be 6-7 cm although body of fetus noted to be in vagina. Cervix gently reduced and head delivered atraumatically with one push. Delivery of non-viable fetus on 02/15/18. Umbilical cord clamped and fetus transferred to mother's chest. Placenta left in situ at this time. EBL 50 mL.         Bertis RuddyKourtnie McQuillen, MD 02/15/2018, 04:06    Late entry for 02/15/2018.  I was present and supervised/observed the entire procedure.    Marcelino ScotLeo Jozey Janco, MD

## 2018-02-13 ENCOUNTER — Inpatient Hospital Stay
Admission: EM | Admit: 2018-02-13 | Discharge: 2018-02-15 | DRG: 805 | Disposition: A | Discharge status: Home / Self Care | Payer: Medicaid Other | Source: Ambulatory Visit | Attending: Obstetrics & Gynecology | Admitting: Obstetrics & Gynecology

## 2018-02-13 ENCOUNTER — Encounter (HOSPITAL_COMMUNITY): Payer: Self-pay

## 2018-02-13 ENCOUNTER — Other Ambulatory Visit: Payer: Self-pay

## 2018-02-13 ENCOUNTER — Emergency Department
Admission: EM | Admit: 2018-02-13 | Discharge: 2018-02-13 | Disposition: A | Discharge status: Other Acute Inpatient Hospital | Payer: PPO

## 2018-02-13 ENCOUNTER — Inpatient Hospital Stay (HOSPITAL_BASED_OUTPATIENT_CLINIC_OR_DEPARTMENT_OTHER): Payer: Medicaid Other

## 2018-02-13 ENCOUNTER — Encounter (HOSPITAL_COMMUNITY): Payer: Medicaid Other

## 2018-02-13 DIAGNOSIS — O99324 Drug use complicating childbirth: Secondary | ICD-10-CM | POA: Diagnosis present

## 2018-02-13 DIAGNOSIS — O9902 Anemia complicating childbirth: Secondary | ICD-10-CM | POA: Diagnosis present

## 2018-02-13 DIAGNOSIS — O99334 Smoking (tobacco) complicating childbirth: Secondary | ICD-10-CM | POA: Diagnosis present

## 2018-02-13 DIAGNOSIS — N760 Acute vaginitis: Secondary | ICD-10-CM | POA: Diagnosis present

## 2018-02-13 DIAGNOSIS — O42912 Preterm premature rupture of membranes, unspecified as to length of time between rupture and onset of labor, second trimester: Secondary | ICD-10-CM | POA: Diagnosis present

## 2018-02-13 DIAGNOSIS — O411220 Chorioamnionitis, second trimester, not applicable or unspecified: Secondary | ICD-10-CM | POA: Diagnosis present

## 2018-02-13 DIAGNOSIS — F1120 Opioid dependence, uncomplicated: Secondary | ICD-10-CM | POA: Diagnosis present

## 2018-02-13 DIAGNOSIS — O9842 Viral hepatitis complicating childbirth: Secondary | ICD-10-CM | POA: Diagnosis present

## 2018-02-13 DIAGNOSIS — Z5321 Procedure and treatment not carried out due to patient leaving prior to being seen by health care provider: Secondary | ICD-10-CM | POA: Insufficient documentation

## 2018-02-13 DIAGNOSIS — O4102X0 Oligohydramnios, second trimester, not applicable or unspecified: Secondary | ICD-10-CM

## 2018-02-13 DIAGNOSIS — O321XX0 Maternal care for breech presentation, not applicable or unspecified: Secondary | ICD-10-CM

## 2018-02-13 DIAGNOSIS — O26892 Other specified pregnancy related conditions, second trimester: Secondary | ICD-10-CM

## 2018-02-13 DIAGNOSIS — Z3A17 17 weeks gestation of pregnancy: Secondary | ICD-10-CM

## 2018-02-13 DIAGNOSIS — O4100X0 Oligohydramnios, unspecified trimester, not applicable or unspecified: Secondary | ICD-10-CM | POA: Diagnosis present

## 2018-02-13 DIAGNOSIS — N390 Urinary tract infection, site not specified: Secondary | ICD-10-CM | POA: Diagnosis present

## 2018-02-13 DIAGNOSIS — N898 Other specified noninflammatory disorders of vagina: Secondary | ICD-10-CM

## 2018-02-13 DIAGNOSIS — O021 Missed abortion: Principal | ICD-10-CM | POA: Diagnosis present

## 2018-02-13 DIAGNOSIS — D649 Anemia, unspecified: Secondary | ICD-10-CM | POA: Diagnosis present

## 2018-02-13 HISTORY — DX: Unspecified viral hepatitis C without hepatic coma: B19.20

## 2018-02-13 HISTORY — DX: Female infertility, unspecified: N97.9

## 2018-02-13 LAB — DRUG SCREEN, NO CONFIRMATION, URINE
AMPHETAMINES URINE: NEGATIVE
BARBITURATES URINE: NEGATIVE
BENZODIAZEPINES URINE: NEGATIVE
BUPRENORPHINE URINE: POSITIVE — AB
BUPRENORPHINE URINE: POSITIVE — AB
CANNABINOIDS URINE: NEGATIVE
COCAINE METABOLITES URINE: NEGATIVE
CREATININE RANDOM URINE: 20 mg/dL
ECSTASY/MDMA URINE: NEGATIVE
METHADONE URINE: NEGATIVE
OPIATES URINE (LOW CUTOFF): NEGATIVE
OXIDANT-ADULTERATION: NEGATIVE
OXYCODONE URINE: NEGATIVE
PH-ADULTERATION: 6.3 (ref 4.5–9.0)
SPECIFIC GRAVITY-ADULTERATION: 1.004 g/mL — ABNORMAL LOW (ref 1.005–1.030)

## 2018-02-13 LAB — URINALYSIS, MACROSCOPIC
BILIRUBIN: NEGATIVE mg/dL
COLOR: NORMAL
GLUCOSE: NEGATIVE mg/dL
KETONES: NEGATIVE mg/dL
NITRITE: NEGATIVE
PH: 6 (ref 5.0–8.0)
PROTEIN: NEGATIVE mg/dL
SPECIFIC GRAVITY: <1.005 — ABNORMAL LOW (ref 1.005–1.030)
UROBILINOGEN: NEGATIVE mg/dL

## 2018-02-13 LAB — HEPATITIS B SURFACE ANTIBODY: HBV SURFACE ANTIBODY QUANTITATIVE: 7 m[IU]/mL (ref <8)

## 2018-02-13 LAB — TYPE AND SCREEN
ABO/RH(D): B POS
ANTIBODY SCREEN: NEGATIVE

## 2018-02-13 LAB — CBC
HCT: 28.7 % — ABNORMAL LOW (ref 34.8–46.0)
HGB: 9.6 g/dL — ABNORMAL LOW (ref 11.5–16.0)
MCH: 32.4 pg — ABNORMAL HIGH (ref 26.0–32.0)
MCHC: 33.4 g/dL (ref 31.0–35.5)
MCV: 97 fL (ref 78.0–100.0)
MPV: 10.8 fL (ref 8.7–12.5)
PLATELETS: 166 x10ˆ3/uL (ref 150–400)
RBC: 2.96 x10ˆ6/uL — ABNORMAL LOW (ref 3.85–5.22)
RDW-CV: 12.8 % (ref 11.5–15.5)
WBC: 9.7 x10?3/uL (ref 3.7–11.0)

## 2018-02-13 LAB — URINALYSIS, MICROSCOPIC
RBCS: 1 /HPF (ref <6.0)
WBCS: 2 /HPF (ref <11.0)

## 2018-02-13 LAB — WETMOUNT
CLUE CELLS: ABSENT
TRICHOMONAS: ABSENT
YEAST: ABSENT

## 2018-02-13 LAB — HEPATITIS C ANTIBODY SCREEN WITH REFLEX TO HCV PCR: HCV ANTIBODY QUALITATIVE: REACTIVE — AB

## 2018-02-13 LAB — HIV1/HIV2 SCREEN, COMBINED ANTIGEN AND ANTIBODY: HIV SCREEN, COMBINED ANTIGEN & ANTIBODY: NEGATIVE

## 2018-02-13 LAB — LAVENDER TOP TUBE

## 2018-02-13 LAB — HEPATITIS B SURFACE ANTIGEN: HBV SURFACE ANTIGEN QUALITATIVE: NEGATIVE

## 2018-02-13 LAB — AMNISURE: AMNISURE: NEGATIVE

## 2018-02-13 MED ORDER — FERROUS SULFATE 324 MG (65 MG IRON) TABLET,DELAYED RELEASE
324.00 mg | DELAYED_RELEASE_TABLET | Freq: Two times a day (BID) | ORAL | Status: DC
Start: 2018-02-13 — End: 2018-02-15
  Administered 2018-02-13 – 2018-02-15 (×5): 324 mg via ORAL
  Filled 2018-02-13 (×8): qty 1

## 2018-02-13 MED ORDER — SODIUM CHLORIDE 0.9 % (FLUSH) INJECTION SYRINGE
2.0000 mL | INJECTION | Freq: Three times a day (TID) | INTRAMUSCULAR | Status: DC
Start: 2018-02-13 — End: 2018-02-15
  Administered 2018-02-13: 0 mL
  Administered 2018-02-14: 2 mL
  Administered 2018-02-14: 22:00:00 0 mL
  Administered 2018-02-14: 2 mL
  Administered 2018-02-15: 0 mL

## 2018-02-13 MED ORDER — ASCORBIC ACID (VITAMIN C) 500 MG TABLET
500.00 mg | ORAL_TABLET | Freq: Two times a day (BID) | ORAL | Status: DC
Start: 2018-02-13 — End: 2018-02-15
  Administered 2018-02-13 – 2018-02-15 (×5): 500 mg via ORAL
  Filled 2018-02-13 (×8): qty 1

## 2018-02-13 MED ORDER — NICOTINE 14 MG/24 HR DAILY TRANSDERMAL PATCH
14.0000 mg | MEDICATED_PATCH | Freq: Every day | TRANSDERMAL | Status: DC
Start: 2018-02-13 — End: 2018-02-15
  Administered 2018-02-13 – 2018-02-14 (×2): 14 mg via TRANSDERMAL
  Filled 2018-02-13 (×4): qty 1

## 2018-02-13 MED ORDER — SODIUM CHLORIDE 0.9 % (FLUSH) INJECTION SYRINGE
2.0000 mL | INJECTION | INTRAMUSCULAR | Status: DC | PRN
Start: 2018-02-13 — End: 2018-02-15

## 2018-02-13 MED ORDER — PRENATAL VIT-IRON-FOLATE TAB WRAPPER
1.0000 | ORAL_TABLET | Freq: Every day | Status: DC
Start: 2018-02-13 — End: 2018-02-15
  Administered 2018-02-13 – 2018-02-15 (×3): 1 via ORAL
  Filled 2018-02-13 (×4): qty 1

## 2018-02-13 NOTE — H&P (Signed)
[place] Department of Obstetric & Gynecology      HISTORY AND PHYSICAL     PATIENT: Chelsea Mccall  CHART NUMBER: [id number]  DATE OF SERVICE: 02/13/2018      PRIMARY OB: [place]      CC: "Leaking of fluid"    HPI: Chelsea Mccall is a 42 y.o. G4P0030 at [redacted]w[redacted]d who presents to labor and delivery for leaking of fluid. Patient was in [facility] when she noticed a large gush of pink tinged fluid around 1730.  She presented to an ED there but was not evaluated. She drove back here to be evaluated. She does not believe that she has continued to leak fluid. She states that she does have some cramping. She hasn't yet felt fetal movement. She denies vaginal bleeding, fever, chills, and urinary symptoms.    ROD:  Dating Summary      Working EDD: 07/21/18 set by Despina Hick, RN on 02/13/18 based on Other Basis          Based On EDD GA Dif GA User Date    Other Basis   Comment: per pt    07/21/18 Working  Despina Hick, RN 02/13/18           OB History:  Lab Results   Component Value Date    ABORHD B POSITIVE 11/01/2015    HGB 10.7 (L) 11/01/2015    HGB 10.9 (L) 10/15/2014    HCT 31.0 (L) 11/01/2015    HCT 33.5 10/15/2014       OB History   Gravida Para Term Preterm AB Living   4       3     SAB TAB Ectopic Multiple Live Births   2 1            # Outcome Date GA Lbr Len/2nd Weight Sex Delivery Anes PTL Lv   4 Current            3 SAB 2016           2 SAB            1 TAB      TAB          PAST MEDICAL HISTORY:  Past Medical History:   Diagnosis Date   . Kidney stone    . Migraine      Per chart review, it states that patient has hepatitis C. Patient did not report this at time of history.         PAST SURGICAL HISTORY:  Past Surgical History:   Procedure Laterality Date   . HX APPENDECTOMY     . HX CHOLECYSTECTOMY     . HX WISDOM TEETH EXTRACTION       FAMILY HISTORY:   Family Medical History:     Problem Relation (Age of Onset)    Breast Cancer Maternal Grandmother, Paternal Grandmother                 SOCIAL HISTORY:  Reports history of opioid dependence. Denies history of IVDU.   Social History     Tobacco Use   . Smoking status: Current Every Day Smoker     Packs/day: 1.00     Types: Cigarettes   . Smokeless tobacco: Never Used   Substance Use Topics   . Alcohol use: No   . Drug use: Yes     Types: Other     Comment: pain pills  CURRENT MEDICATIONS:   Medications Prior to Admission     Prescriptions    folic acid (FOLVITE) 1 mg Oral Tablet    Take 1 Tab (1 mg total) by mouth Once a day    Ibuprofen (MOTRIN) 600 mg Oral Tablet    Take 1 Tab (600 mg total) by mouth Four times a day as needed for Pain    PNV119-iron fumarate-FA-DSS 29 mg iron- 1 mg-25 mg Oral Tablet    Take 1 Tab by mouth Once a day           ALLERGIES:  Naproxen     REVIEW OF SYSTEMS: Other than ROS in the HPI, all other systems were negative.    PHYSICAL EXAMINATION:   There were no vitals filed for this visit.    General: appears in good health and no distress  HENNT: NCAT, no cervical LAD  Lungs: Clear to auscultation bilaterally. , No wheezing, rhonchi, or crackles.  Cardiovascular: regular rate and rhythm, S1, S2 normal, no murmur, click, rub or gallop  Abdomen: Gravid. Soft, non-tender.  Extremities: No cyanosis or edema  Neuro: Grossly normal.  Psych: Anxious    SVE: 1/thick/high  SSE: Frothy yellow discharge present. No pooling. Ferning negative. Nitrazine negative.  PRESENTATION:  Breech by ultrasound.  Bedside US: Minimal fluid present around fetus    FHRT:  169 bpm by US  TOCO: No ctx    CURRENT LABS:   Labs:    Lab Results Today:    Results for orders placed or performed during the hospital encounter of 02/13/18 (from the past 24 hour(s))   AMNISURE   Result Value Ref Range    AMNISURE Negative Negative   WETMOUNT   Result Value Ref Range    TRICHOMONAS Absent Absent    YEAST Absent Absent    CLUE CELLS Absent Absent         ASSESSMENT/PLAN: 42 y.o. G4P0030 at 4154w3d    PNC  PNV  B POSITIVE  CfDNA: low risk, XX    Leaking  of Fluid  No evidence of rupture but minimal fluid present on US  Admit to L&D for observation  Labs ordered: CBC, type and screen, UDS, and syphilis  Formal US ordered  Repeat SSE in am  Continue to closely monitor    Dema Severinaitlin M Kowcheck, MD 02/13/2018 06:32  PGY-4  [facility]  Department of Obstetrics & Gynecology              Late entry for 02/13/2018. I saw and examined the patient.  I reviewed the resident's note.  I agree with the findings and plan of care as documented in the resident's note.  Any exceptions/additions are edited/noted.    Marcelino ScotLeo Meriah Shands, MD

## 2018-02-14 ENCOUNTER — Encounter (HOSPITAL_COMMUNITY): Payer: Self-pay | Admitting: Student in an Organized Health Care Education/Training Program

## 2018-02-14 ENCOUNTER — Inpatient Hospital Stay (HOSPITAL_COMMUNITY): Payer: Medicaid Other | Admitting: Student in an Organized Health Care Education/Training Program

## 2018-02-14 DIAGNOSIS — O418X20 Other specified disorders of amniotic fluid and membranes, second trimester, not applicable or unspecified: Secondary | ICD-10-CM

## 2018-02-14 DIAGNOSIS — O99322 Drug use complicating pregnancy, second trimester: Secondary | ICD-10-CM

## 2018-02-14 DIAGNOSIS — O411220 Chorioamnionitis, second trimester, not applicable or unspecified: Secondary | ICD-10-CM

## 2018-02-14 DIAGNOSIS — Z3A17 17 weeks gestation of pregnancy: Secondary | ICD-10-CM

## 2018-02-14 DIAGNOSIS — O99012 Anemia complicating pregnancy, second trimester: Secondary | ICD-10-CM

## 2018-02-14 DIAGNOSIS — Z79891 Long term (current) use of opiate analgesic: Secondary | ICD-10-CM

## 2018-02-14 DIAGNOSIS — O98412 Viral hepatitis complicating pregnancy, second trimester: Secondary | ICD-10-CM

## 2018-02-14 DIAGNOSIS — B1920 Unspecified viral hepatitis C without hepatic coma: Secondary | ICD-10-CM

## 2018-02-14 DIAGNOSIS — D6489 Other specified anemias: Secondary | ICD-10-CM

## 2018-02-14 DIAGNOSIS — O99332 Smoking (tobacco) complicating pregnancy, second trimester: Secondary | ICD-10-CM

## 2018-02-14 DIAGNOSIS — F17210 Nicotine dependence, cigarettes, uncomplicated: Secondary | ICD-10-CM

## 2018-02-14 DIAGNOSIS — F1121 Opioid dependence, in remission: Secondary | ICD-10-CM

## 2018-02-14 LAB — CBC
HCT: 27.3 % — ABNORMAL LOW (ref 34.8–46.0)
HGB: 9.3 g/dL — ABNORMAL LOW (ref 11.5–16.0)
MCH: 32.5 pg — ABNORMAL HIGH (ref 26.0–32.0)
MCHC: 34.1 g/dL (ref 31.0–35.5)
MCV: 95.5 fL (ref 78.0–100.0)
MPV: 10.9 fL (ref 8.7–12.5)
PLATELETS: 158 10*3/uL (ref 150–400)
RBC: 2.86 10*6/uL — ABNORMAL LOW (ref 3.85–5.22)
RDW-CV: 12.7 % (ref 11.5–15.5)
WBC: 13.4 10*3/uL — ABNORMAL HIGH (ref 3.7–11.0)

## 2018-02-14 LAB — CHLAMYDIA AND NEISSERIA DNA BY PCR
CHLAMYDIA TRACHOMATIS PCR: NOT DETECTED
NEISSERIA GONORRHOEAE PCR: NOT DETECTED

## 2018-02-14 LAB — WETMOUNT
TRICHOMONAS: ABSENT
YEAST: ABSENT

## 2018-02-14 LAB — SYPHILIS, RAPID PLASMIN REAGIN (RPR), WITH TITER REFLEX: RPR QUALITATIVE: NONREACTIVE

## 2018-02-14 LAB — AMNISURE: AMNISURE: POSITIVE — AB

## 2018-02-14 LAB — HEPATITIS C VIRUS (HCV) RNA DETECTION AND QUANTIFICATION, PCR, PLASMA: HCV QUANTITATIVE PCR: NOT DETECTED

## 2018-02-14 LAB — URINE CULTURE: URINE CULTURE: >100000 — AB

## 2018-02-14 MED ORDER — PROCHLORPERAZINE EDISYLATE 10 MG/2 ML (5 MG/ML) INJECTION SOLUTION
5.0000 mg | Freq: Four times a day (QID) | INTRAMUSCULAR | Status: DC | PRN
Start: 2018-02-14 — End: 2018-02-15
  Administered 2018-02-14: 5 mg via INTRAVENOUS
  Filled 2018-02-14: qty 2

## 2018-02-14 MED ORDER — LIDOCAINE-EPINEPHRINE (PF) 1.5 %-1:200,000 INJECTION SOLUTION
Freq: Once | INTRAMUSCULAR | Status: DC | PRN
Start: 2018-02-14 — End: 2018-02-15
  Administered 2018-02-14: 3 mL via EPIDURAL

## 2018-02-14 MED ORDER — DIPHENHYDRAMINE 50 MG/ML INJECTION SOLUTION
12.5000 mg | Freq: Four times a day (QID) | INTRAMUSCULAR | Status: DC | PRN
Start: 2018-02-14 — End: 2018-02-15
  Administered 2018-02-14: 12.5 mg via INTRAVENOUS
  Filled 2018-02-14: qty 1

## 2018-02-14 MED ORDER — SODIUM CHLORIDE 0.9 % INTRAVENOUS SOLUTION
2.00 g | Freq: Four times a day (QID) | INTRAVENOUS | Status: DC
Start: 2018-02-14 — End: 2018-02-15
  Administered 2018-02-14: 2 g via INTRAVENOUS
  Administered 2018-02-14 – 2018-02-15 (×2): 0 g via INTRAVENOUS
  Administered 2018-02-15: 2 g via INTRAVENOUS
  Filled 2018-02-14 (×3): qty 20

## 2018-02-14 MED ORDER — FENTANYL (PF) 2 MCG/ML-BUPIVACAINE 0.125 %-NACL INJECTION SOLUTION
INTRAMUSCULAR | Status: DC
Start: 2018-02-14 — End: 2018-02-15
  Administered 2018-02-14: 8 mL/h via EPIDURAL
  Filled 2018-02-14: qty 250

## 2018-02-14 MED ORDER — LACTATED RINGERS INTRAVENOUS SOLUTION
INTRAVENOUS | Status: DC
Start: 2018-02-14 — End: 2018-02-15

## 2018-02-14 MED ORDER — SODIUM CHLORIDE 0.9 % INTRAVENOUS SOLUTION
1.0000 g | Freq: Four times a day (QID) | INTRAVENOUS | Status: DC
Start: 2018-02-14 — End: 2018-02-14

## 2018-02-14 MED ORDER — ROPIVACAINE (PF) 2 MG/ML (0.2 %) INJECTION SOLUTION
Freq: Once | INTRAMUSCULAR | Status: DC | PRN
Start: 2018-02-14 — End: 2018-02-15
  Administered 2018-02-14 (×2): 5 mL via EPIDURAL

## 2018-02-14 MED ORDER — BUPRENORPHINE HCL 2 MG SUBLINGUAL TABLET
2.0000 mg | SUBLINGUAL_TABLET | Freq: Every day | SUBLINGUAL | Status: DC
Start: 2018-02-14 — End: 2018-02-15
  Administered 2018-02-14 – 2018-02-15 (×2): 2 mg via SUBLINGUAL
  Filled 2018-02-14 (×2): qty 1

## 2018-02-14 MED ORDER — GENTAMICIN 40 MG/ML INJECTION SOLUTION
5.00 mg/kg | INTRAMUSCULAR | Status: DC
Start: 2018-02-14 — End: 2018-02-15
  Administered 2018-02-14: 280 mg via INTRAVENOUS
  Administered 2018-02-14: 0 mg via INTRAVENOUS
  Filled 2018-02-14: qty 7

## 2018-02-14 MED ORDER — PCA - HYDROMORPHONE 1 MG/ML IN NS
INJECTION | INTRAMUSCULAR | Status: AC
Start: 2018-02-14 — End: 2018-02-14
  Filled 2018-02-14: qty 100

## 2018-02-14 MED ORDER — PCA - HYDROMORPHONE 1 MG/ML IN NS
INJECTION | INTRAMUSCULAR | Status: DC
Start: 2018-02-14 — End: 2018-02-14
  Filled 2018-02-14: qty 100

## 2018-02-14 MED ORDER — OXYTOCIN 30 UNIT/500 ML IN 0.9 % SODIUM CHLORIDE INTRAVENOUS
1.0000 m[IU]/min | INTRAVENOUS | Status: DC
Start: 2018-02-15 — End: 2018-02-15
  Administered 2018-02-15: 4 m[IU]/min via INTRAVENOUS
  Administered 2018-02-15: 6 m[IU]/min via INTRAVENOUS
  Administered 2018-02-15: 10 m[IU]/min via INTRAVENOUS
  Administered 2018-02-15: 0 m[IU]/min via INTRAVENOUS
  Administered 2018-02-15: 8 m[IU]/min via INTRAVENOUS
  Administered 2018-02-15: 2 m[IU]/min via INTRAVENOUS
  Administered 2018-02-15: 01:00:00 6 m[IU]/min via INTRAVENOUS

## 2018-02-14 MED ORDER — LACTATED RINGERS IV BOLUS
500.0000 mL | INJECTION | Status: AC
Start: 2018-02-14 — End: 2018-02-14
  Administered 2018-02-14: 500 mL via INTRAVENOUS
  Administered 2018-02-14: 0 mL via INTRAVENOUS

## 2018-02-14 MED ORDER — NITROFURANTOIN MONOHYDRATE/MACROCRYSTALS 100 MG CAPSULE
100.0000 mg | ORAL_CAPSULE | Freq: Two times a day (BID) | ORAL | Status: DC
Start: 2018-02-14 — End: 2018-02-14
  Administered 2018-02-14: 100 mg via ORAL
  Filled 2018-02-14 (×3): qty 1

## 2018-02-14 MED ORDER — SODIUM CHLORIDE 0.9 % INTRAVENOUS SOLUTION
1.5000 mg/kg | Freq: Three times a day (TID) | INTRAVENOUS | Status: DC
Start: 2018-02-14 — End: 2018-02-14

## 2018-02-14 MED ORDER — FENTANYL (PF) 50 MCG/ML INJECTION SOLUTION
25.0000 ug | INTRAMUSCULAR | Status: AC
Start: 2018-02-14 — End: 2018-02-14
  Administered 2018-02-14: 25 ug via INTRAVENOUS
  Filled 2018-02-14: qty 2

## 2018-02-14 MED ORDER — METRONIDAZOLE 250 MG TABLET
500.0000 mg | ORAL_TABLET | Freq: Two times a day (BID) | ORAL | Status: DC
Start: 2018-02-14 — End: 2018-02-15
  Administered 2018-02-14 – 2018-02-15 (×3): 500 mg via ORAL
  Filled 2018-02-14 (×3): qty 2

## 2018-02-14 MED ORDER — ONDANSETRON HCL (PF) 4 MG/2 ML INJECTION SOLUTION
4.00 mg | Freq: Three times a day (TID) | INTRAMUSCULAR | Status: DC | PRN
Start: 2018-02-14 — End: 2018-02-14
  Administered 2018-02-14: 4 mg via INTRAVENOUS
  Filled 2018-02-14 (×2): qty 2

## 2018-02-14 MED ORDER — ONDANSETRON HCL (PF) 4 MG/2 ML INJECTION SOLUTION
4.0000 mg | INTRAMUSCULAR | Status: DC | PRN
Start: 2018-02-14 — End: 2018-02-15
  Administered 2018-02-14: 4 mg via INTRAVENOUS

## 2018-02-14 MED ORDER — GENTAMICIN IV - PHARMACIST TO DOSE PER PROTOCOL
Freq: Every day | Status: DC | PRN
Start: 2018-02-14 — End: 2018-02-15

## 2018-02-14 MED ADMIN — atorvastatin 40 mg tablet: @ 20:00:00

## 2018-02-14 NOTE — Progress Notes (Signed)
PATIENT:  Chelsea GaleaJamie Marie Mccall   MRN:  [id number]   DATE OF SERVICE:  02/14/2018, 14:46    S: patient with increased pain and vomiting. Upon entering the room, patient vomiting and in the fetal position. Appears to be having contractions approximately every 2 minutes.     Ceasar Mons:    Filed Vitals:    02/13/18 2042 02/14/18 0004 02/14/18 0938 02/14/18 1244   BP: 103/60 113/64 97/60    Pulse: 96 93 90    Resp: 16 12 18     Temp:  36.3 C (97.3 F) 37.2 C (99 F) 36.8 C (98.2 F)      SVE: 1/70/-3    Plan: [id number] at 3265w4d     PPROM I Anhydramnios I Labor  - cervical change: 1/70/-3 from 1/thick/high on initial exam  - Check temp q1-2 hours (monitoring for chorio)  - Fentanyl ordered for pain at this time   - Zofran for nausea   - Plan for PCA pump - declines epidural and prefer PCA pump     Hepatitis C   - HCV Antibody qualitative - reactive   - Hepatitis C Virus RNA Detection and Quantiferon pending   - Patient did not verify during initial evaluation but present in patient records     Opioid Dependence   - denies IVDU   - HIV negative   - Takes 1/4 tab every 3-4 days  - [place] [facility] [place]               - Spoke with clinic on the phone directly today                - Verified patient dosing: 8mg  BID subutex                             - Last Rx 01/30/18                             - Has been with clinic since 11/2017  - UDS: buprenorphine positive   - Per patient, does not want high dose as she rarely takes medication. After speaking with her directly, ordered 2mg  subutex daily but can increase per patient    Anemia   - Initial Hb: 9.6  - Iron and Vitamin C Started    Tobacco Use in Pregnancy  - nicoteine patch ordered     Bacterial Vaginosis   - Flagyl 500mg  BID started 02/14/18 until 02/20/18     UTI   - urine culture >100,000 e. Coli   - Macrobid 100mg  BID started 02/14/18  - F/u sensitivities         Verne GrainJennifer Silk, DO PGY-1  02/14/2018 14:46  [facility] [place]  Department of Obstetrics &  Gynecology      Late entry for 02/14/18. I saw and examined the patient.  I reviewed the resident's note.  I agree with the findings and plan of care as documented in the resident's note.  Any exceptions/additions are edited/noted.    Vanessa KickSarah Kazoua Gossen, MD

## 2018-02-14 NOTE — Progress Notes (Signed)
Chelsea GaleaJamie Marie Mccall  [id number]  02/14/2018    Patient resting in bed. No complaints voiced.    Vitals:    02/14/18 1843 02/14/18 2006 02/14/18 2100 02/14/18 2238   BP: (!) 103/48 (!) 90/51 (!) 98/57 (!) 90/56   Pulse:  85 90 96   Resp:  16 16 16    Temp:  37 C (98.6 F) 37.4 C (99.3 F) 37.4 C (99.3 F)   SpO2:       Weight:       Height:         SVE: 4-5/90/+1    42 y.o.G4P0030 2518w4d with PPROM and chorioamnionitis   - Continue Amp/Gent  - SVE unchanged  - Begin Pitocin, titrate as tolerated    Bertis RuddyKourtnie McQuillen MD 02/14/2018 23:15  PGY-3  [facility] [place]  Obstetrics and Gynecology            Late entry for 02/14/2018. I saw and examined the patient.  I reviewed the resident's note.  I agree with the findings and plan of care as documented in the resident's note.  Any exceptions/additions are edited/noted.    Marcelino ScotLeo Ardella Chhim, MD

## 2018-02-14 NOTE — Nurses Notes (Signed)
C/O nausea and vomiting with intermittent cramping. MD aware. Medicated for nausea per order. Emotional support provided. Patient not sure if "her body is getting rid of the baby naturally" or what is happening. Questions encouraged and answered. Will continue with plan of care and to monitor.

## 2018-02-14 NOTE — Care Plan (Signed)
Patient resting in bed most of night, RN encouraged her to wear pad so it would be easier to monitor color/consistency/odor. Only scant amount of clear, odorless fluid on pad. Positive fetal movement, patient states her FOB has not been involved since admission and dealing with family difficulties. Patient also expresses no one in her family knows about addiction history and is concerned her sister works at the hospital she will find out. RN explained HIPPA, privacy, etc patient verbalizes understanding. Suboxone medication sent to pharmacy, physicians will be contacting clinic tomorrow for prescription to be given in hospital. Questions encouraged and answered, will continue to monitor.

## 2018-02-14 NOTE — Nurses Notes (Signed)
Late entry due to patient care. Patient stated to RN that she was having some "upset stomach feelings" RN provided ginger ale and popsicle. When reevaluating if patient felt better she said yes and that she had also taken 1/2 of her suboxone that she brought in with her. RN explained that all meds would need to be verified with pharmacy and not to take anything else. Dr. Trinda PascalMcQuillen notified, paper work for pharmacy completed. Medicine count double verified by RN and Ashley JacobsEmily Nesselrodt RN, pharmacy contacted and will be coming to floor to pick up medicine from RN. Patient verbalized understanding, will continue to monitor.

## 2018-02-14 NOTE — Progress Notes (Signed)
[place] Department of Obstetric & Gynecology      L&D Labor Note      DATE OF SERVICE: 02/14/2018, 16:53   PATIENT: Chelsea Mccall  CHART NUMBER: [id number]      S: Patient recently had PCA placed but continues to have pain.     Ceasar Mons:   Filed Vitals:    02/14/18 0938 02/14/18 1244 02/14/18 1620 02/14/18 1630   BP: 97/60  (!) 92/45 116/60   Pulse: 90      Resp: 18      Temp: 37.2 C (99 F) 36.8 C (98.2 F)         SVE: attempted to check cervix in hands and knees, cervix very anterior, patient uncomfortable    Per nursing palpation and symptoms, patient appears to be having contractions every 2-3 minutes.       A/P: 42 y.o. G4P0030 @ 3336w4d     PPROM I Anhydramnios I Labor  - difficult cervical exam due to patient position but cervix is not complete and patient is significant pain, declined further exam at this time   - Due to continued vomiting, LR bolus x 1 hour then continuous LR fluids ordered  - Called anesthesia for epidural per patient request   - Compazine ordered for nausea along with PRN nausea  - Continue to monitor closely     Hepatitis C   - HCV Antibody qualitative - reactive   - Hepatitis C Virus RNA Detection and Quantiferon pending   - Patient did not verify during initial evaluation but present in patient records     Opioid Dependence   - denies IVDU   - HIV negative   - Takes 1/4 tab every 3-4 days  - [place]s with [facility] [place]  - Spoke with clinic on the phone directly today   - Verified patient dosing: 8mg  BID subutex   - Last Rx 01/30/18   - Has been with clinic since 11/2017  - UDS: buprenorphine positive   - Per patient, does not want high dose as she rarely takes medication. After speaking with her directly, ordered 2mg  subutex daily but can increase per patient    Anemia   - Initial Hb: 9.6  - Iron and Vitamin C Started    Tobacco Use in Pregnancy  - nicoteine patch ordered     Bacterial Vaginosis      - Flagyl 500mg  BID started 02/14/18 until 02/20/18     UTI   - urine culture >100,000 e. Coli   - Macrobid 100mg  BID started 02/14/18  - F/u sensitivities      Verne GrainJennifer Silk, DO PGY-1  02/14/2018 16:53  [facility]   Department of Obstetrics & [facility]necology       Late entry for 02/14/18. I saw and examined the patient.  I reviewed the resident's note.  I agree with the findings and plan of care as documented in the resident's note.  Any exceptions/additions are edited/noted.    Vanessa KickSarah Benjamen Koelling, MD

## 2018-02-14 NOTE — Anesthesia Procedure Notes (Signed)
Lumbar Epidural   Indication: pain relief in labor and delivery        Pt location: At bedside  Technique: ( See MAR for exact doses)  Technique/Approach: midline      Needle Level: L4-5  Sterile Skin Prep : aseptic technique, sterile drape, sterile technique, sterilely prepped and draped, sterile field established, sterile gloves and mask     Site verified, H&P updated and consent obtained, Patient monitors applied, Timeout performed, Emergency drugs and equipment available, Patient positioned and anesthesia consent given   Patient position: sitting   Skin local: Lidocaine 1%   Needle/Catheter: Needle type: Hustead   Needle Gauge: 18 G  Needle length: 3.5 in  Epidural Injection Technique LOR saline and LOR air  Needle insertion depth 5 cm Catheter length in space: 5 cm   Catheter at skin depth: 10 cm  Epidural catheter location: lumbar (1-5)  Number of attempts: 1  Events: neg aspiration and no complications,         Dosing:      Test Dose: 3 mL,  lidocaine 1.5% with epinephrine 1:200,000       Negative test - not INTRAVENOUS  and Negative test - not Subarachnoid   Injection made incrementally with aspirations every 5mL .  Catheter: secured and sterile dressing applied         Patient response: comfortable and adequate sensory block    Performed by:    Performing Provider:  Lysle RubensKotecki, Ryan, DO  Authorizing Provider:  Luellen PuckerHeiraty, Pooya, MD

## 2018-02-14 NOTE — Progress Notes (Addendum)
[place] Department of Obstetric & Gynecology      Antepartum Rounding Note     PATIENT: Chelsea Mccall  CHART NUMBER: [id number]  DATE OF SERVICE: 02/14/2018      PRIMARY OB: [facility]      CC: "Leaking of fluid"    HPI: Chelsea GaleaJamie Marie Jun is a 42 y.o. G4P0030 at 4153w4d. Initially presented yesterday for LOF. Initially large gush of fluid on 02/12/18 but no leaking since. States she has small yellow discharge overnight but no significant leaking of fluid. Initially felt well this AM but when returned to room to repeat her SVE, she states she was having increased cramping that just started this morning at vomited x 1 this AM. Denies VB. States she is unsure if she feels fetal movement but notes "fluttering." Denies CP, SOB, Edema.     Spoke with patient regarding her OB and obtained name of OB in an attempt to collect prenatal records.    - OB: Hessie Knowsoy Clark MD   Also able to get information from patient regarding her subutex provider. States she goes to:   [facility] [address] [place] [id number]   - [phone]      ROD:  Dating Summary      Working EDD: 07/21/18 set by Despina HickHickle, Pattie, RN on 02/13/18 based on Other Basis          Based On EDD GA Dif GA User Date    Other Basis   Comment: per pt    07/21/18 Working  Despina HickHickle, Pattie, RN 02/13/18    Last Menstrual Period on 10/14/17   07/21/18 Same  Hollace KinnierMcLaughlin, Emmy L, RN 02/13/18           OB History:  Lab Results   Component Value Date    ABORHD B POSITIVE 02/13/2018    HGB 9.6 (L) 02/13/2018    HGB 10.9 (L) 10/15/2014    HCT 28.7 (L) 02/13/2018    HCT 33.5 10/15/2014       OB History   Gravida Para Term Preterm AB Living   4       3     SAB TAB Ectopic Multiple Live Births   2 1            # Outcome Date GA Lbr Len/2nd Weight Sex Delivery Anes PTL Lv   4 Current            3 SAB 2016           2 SAB            1 TAB      TAB          PAST MEDICAL HISTORY:  Past Medical History:   Diagnosis Date   . Female infertility    . Hepatitis  C    . Kidney stone    . Migraine      Per chart review, it states that patient has hepatitis C. Patient did not report this at time of history.         PAST SURGICAL HISTORY:  Past Surgical History:   Procedure Laterality Date   . HX APPENDECTOMY     . HX CHOLECYSTECTOMY     . HX WISDOM TEETH EXTRACTION       FAMILY HISTORY:   Family Medical History:     Problem Relation (Age of Onset)    Breast Cancer Maternal Grandmother, Paternal Grandmother  SOCIAL HISTORY:  Reports history of opioid dependence. Denies history of IVDU.   Social History     Tobacco Use   . Smoking status: Current Every Day Smoker     Packs/day: 0.50     Types: Cigarettes   . Smokeless tobacco: Never Used   Substance Use Topics   . Alcohol use: No   . Drug use: Yes     Types: Other     Comment: pain pills        CURRENT MEDICATIONS:   Medications Prior to Admission     Prescriptions    folic acid (FOLVITE) 1 mg Oral Tablet    Take 1 Tab (1 mg total) by mouth Once a day    Ibuprofen (MOTRIN) 600 mg Oral Tablet    Take 1 Tab (600 mg total) by mouth Four times a day as needed for Pain    PNV119-iron fumarate-FA-DSS 29 mg iron- 1 mg-25 mg Oral Tablet    Take 1 Tab by mouth Once a day           ALLERGIES:  Naproxen     REVIEW OF SYSTEMS: Other than ROS in the HPI, all other systems were negative.    PHYSICAL EXAMINATION:   Filed Vitals:    02/13/18 0832 02/13/18 1613 02/13/18 2042 02/14/18 0004   BP: (!) 102/56 104/61 103/60 113/64   Pulse: 91 93 96 93   Resp:  18 16 12    Temp: 36.6 C (97.9 F) 36.8 C (98.2 F) 37.1 C (98.8 F) 36.3 C (97.3 F)       General: appears in good health and no distress  Lungs: nonlabored breathing   Cardiovascular: appeats well perfused  Abdomen: Gravid. Soft, non-tender.  Extremities: No cyanosis or edema  Neuro: Grossly normal.  Psych: Anxious    Initial cervical exam on admission 02/13/18:   SVE: 1/thick/high  SSE: Frothy yellow discharge present. No pooling. Ferning negative. Nitrazine  negative.  PRESENTATION:  Breech by ultrasound.  Bedside US: Minimal fluid present around fetus  FHRT:  169 bpm by US  TOCO: No ctx    Repeat Exam today   SSE: Yellow discharge present, no pooling, negative nitrazine, negative ferning.  Wetmount collected again. Amnisure Collected again.     CURRENT LABS:   Labs:      Results for orders placed or performed during the hospital encounter of 02/13/18 (from the past 24 hour(s))   AMNISURE   Result Value Ref Range    AMNISURE Negative Negative   WETMOUNT   Result Value Ref Range    TRICHOMONAS Absent Absent    YEAST Absent Absent    CLUE CELLS Absent Absent   URINALYSIS, MACROSCOPIC   Result Value Ref Range    SPECIFIC GRAVITY <1.005 (L) 1.005 - 1.030    GLUCOSE Negative Negative mg/dL    PROTEIN Negative Negative mg/dL    BILIRUBIN Negative Negative mg/dL    UROBILINOGEN Negative Negative mg/dL    PH 6.0 5.0 - 8.0    BLOOD Small (A) Negative mg/dL    KETONES Negative Negative mg/dL    NITRITE Negative Negative    LEUKOCYTES Trace (A) Negative WBCs/uL    APPEARANCE Clear Clear    COLOR Normal (Yellow) Normal (Yellow)   URINALYSIS, MICROSCOPIC   Result Value Ref Range    WBCS 2.0 <11.0 /hpf    RBCS 1.0 <6.0 /hpf    BACTERIA Occasional or less Occasional or less /hpf    SQUAMOUS EPITHELIAL CELLS  Occasional or less Occasional or less /lpf    TRANSITIONAL EPITHELIAL Few (A) Occasional or less /lpf    MUCOUS Light Light /lpf   CBC   Result Value Ref Range    WBC 9.7 3.7 - 11.0 x10^3/uL    RBC 2.96 (L) 3.85 - 5.22 x10^6/uL    HGB 9.6 (L) 11.5 - 16.0 g/dL    HCT 42.5 (L) 95.6 - 46.0 %    MCV 97.0 78.0 - 100.0 fL    MCH 32.4 (H) 26.0 - 32.0 pg    MCHC 33.4 31.0 - 35.5 g/dL    RDW-CV 38.7 56.4 - 33.2 %    PLATELETS 166 150 - 400 x10^3/uL    MPV 10.8 8.7 - 12.5 fL   LABOR AND DELIVERY EVALUATION   Result Value Ref Range    PREV. TESTED BLOOD TYPE B POSITIVE PREVIOUSLY TESTED    DRUG SCREEN, LOW OPIATE CUTOFF, NO CONFIRMATION, URINE   Result Value Ref Range    BUPRENORPHINE URINE  Positive (A) Negative    CANNABINOIDS URINE Negative Negative    COCAINE METABOLITES URINE Negative Negative    METHADONE URINE Negative Negative    OPIATES URINE (LOW CUTOFF) Negative Negative    OXYCODONE URINE Negative Negative    ECSTASY/MDMA URINE Negative Negative    CREATININE RANDOM URINE 20 No Reference Range Established mg/dL    AMPHETAMINES URINE Negative Negative    BARBITURATES URINE Negative Negative    BENZODIAZEPINES URINE Negative Negative    OXIDANT-ADULTERATION Negative Negative    PH-ADULTERATION 6.3 4.5 - 9.0    SPECIFIC GRAVITY-ADULTERATION 1.004 (L) 1.005 - 1.030 g/mL   HEPATITIS C ANTIBODY SCREEN WITH REFLEX TO HCV PCR   Result Value Ref Range    HCV ANTIBODY QUALITATIVE Reactive (A) Negative   HIV1/HIV2 SCREEN, COMBINED ANTIGEN AND ANTIBODY   Result Value Ref Range    HIV SCREEN, COMBINED ANTIGEN & ANTIBODY Negative Negative   HEPATITIS B SURFACE ANTIBODY   Result Value Ref Range    HBV SURFACE ANTIBODY QUANTITATIVE 7 <8 mIU/mL    HEPATITIS B SURFACE ANTIGEN   Result Value Ref Range    HBV SURFACE ANTIGEN QUALITATIVE Negative Negative   TYPE AND SCREEN   Result Value Ref Range    UNITS ORDERED NOT STATED         ABO/RH(D) B POSITIVE     ANTIBODY SCREEN NEGATIVE     SPECIMEN EXPIRATION DATE 02/16/2018    LAVENDER TOP TUBE   Result Value Ref Range    RAINBOW/EXTRA TUBE AUTO RESULT Yes          ASSESSMENT/PLAN: 42 y.o. G4P0030 at [redacted]w[redacted]d    PNC  PNV  B POSITIVE  CfDNA: low risk, XX  Rubella immune     Anhydramnios I R/o PPROM  One episode Leaking of Fluid 8/10  02/13/18 exam - amnisure/pooling/nitrazine/ferning negative   No evidence of rupture but minimal fluid present on [place] on admission  Formal US 02/13/18    - no measurable amniotic fluid pockets able to be read on formal ultrasound    - short internal follow up recommended   Repeat SSE in am   - thick yellow discharge    - ferning/pooling/nitrazine negative    - Repeat Wetmount and Amnisure collected  Continue to closely monitor    Hepatitis C    - HCV Antibody qualitative - reactive   - Hepatitis C Virus RNA Detection and Quantiferon pending   - Patient did not verify during  initial evaluation but present in patient records     Opioid Dependence   - denies IVDU   - HIV negative   - Prescribed subutex 8mg  daily   - Takes 1/4 tab every 3-4 days  - Follows with [facility] in [place]   - Spoke with clinic on the phone directly today    - Verified patient dosing: 8mg  BID subutex     - Last Rx 01/30/18     - Has been with clinic since 11/2017  - Per NarxCheck - last Rx from 12/2016, may need to confirm dosing and rx   - Subutex currently not ordered  - UDS: buprenorphine positive     Anemia   - Initial Hb: 9.6  - Iron and Vitamin C Started    Tobacco Use in Pregnancy  - nicoteine patch ordered     Questionable low lying placenta   - posterior placenta with placental tip near the internal cervical os   - short term interval follow up recommended on ultrasound read       Verne Grain, DO PGY-1  02/14/2018 05:40  [facility]  Department of Obstetrics & Gynecology        ADDENDUM 02/14/18 08:57  Amnisure positive - positive ROM    - patient informed by Dr. Ellin Mayhew   - Patient is to decide if she would like to proceed with termination vs expectant management    - Monitor for signs and symptoms of chorioamnionitis closely   - Will follow up with patient later today   WetMount - Positive for BV    - flagyl to be started     Verne Grain, DO PGY-1  02/14/2018 08:58  [facility]  Department of Obstetrics & Gynecology              Late entry for 02/14/2018. I saw and examined the patient.  I reviewed the resident's note.  I agree with the findings and plan of care as documented in the resident's note.  Any exceptions/additions are edited/noted.    Marcelino Scot, MD            I saw and examined the patient.  I reviewed the resident's note.  I agree with the findings and plan of care as documented in the resident's note.  Any exceptions/additions  are edited/noted.    Marcelino Scot, MD

## 2018-02-14 NOTE — H&P (Deleted)
[facility] of Obstetric & Gynecology      HISTORY AND PHYSICAL     PATIENT: Chelsea Mccall  CHART NUMBER: [id number]  DATE OF SERVICE: 02/14/2018      PRIMARY OB: [facility]      CC: "Leaking of fluid"    HPI: Chelsea Mccall is a 42 y.o. G4P0030 at [redacted]w[redacted]d. Initially presented yesterday for LOF. Initially large gush of fluid on 02/12/18 but no leaking since. States she has small yellow discharge overnight but no significant leaking of fluid. Initially felt well this AM but when returned to room to repeat her SVE, she states she was having increased cramping that just started this morning at vomited x 1 this AM. Denies VB. States she is unsure if she feels fetal movement but notes "fluttering." Denies CP, SOB, Edema.     Spoke with patient regarding her OB and obtained name of OB in an attempt to collect prenatal records.    - OB: Hessie Knows MD   Also able to get information from patient regarding her subutex provider. States she goes to:   [facility] [address] [place] [address]   - [phone]      ROD:  Dating Summary      Working EDD: 07/21/18 set by Despina Hick, RN on 02/13/18 based on Other Basis          Based On EDD GA Dif GA User Date    Other Basis   Comment: per pt    07/21/18 Working  Despina Hick, RN 02/13/18    Last Menstrual Period on 10/14/17   07/21/18 Same  Hollace Kinnier, RN 02/13/18           OB History:  Lab Results   Component Value Date    ABORHD B POSITIVE 02/13/2018    HGB 9.6 (L) 02/13/2018    HGB 10.9 (L) 10/15/2014    HCT 28.7 (L) 02/13/2018    HCT 33.5 10/15/2014       OB History   Gravida Para Term Preterm AB Living   4       3     SAB TAB Ectopic Multiple Live Births   2 1            # Outcome Date GA Lbr Len/2nd Weight Sex Delivery Anes PTL Lv   4 Current            3 SAB 2016           2 SAB            1 TAB      TAB          PAST MEDICAL HISTORY:  Past Medical History:   Diagnosis Date   . Female infertility    . Hepatitis C       . Kidney stone    . Migraine      Per chart review, it states that patient has hepatitis C. Patient did not report this at time of history.         PAST SURGICAL HISTORY:  Past Surgical History:   Procedure Laterality Date   . HX APPENDECTOMY     . HX CHOLECYSTECTOMY     . HX WISDOM TEETH EXTRACTION       FAMILY HISTORY:   Family Medical History:     Problem Relation (Age of Onset)    Breast Cancer Maternal Grandmother, Paternal Grandmother  SOCIAL HISTORY:  Reports history of opioid dependence. Denies history of IVDU.   Social History     Tobacco Use   . Smoking status: Current Every Day Smoker     Packs/day: 0.50     Types: Cigarettes   . Smokeless tobacco: Never Used   Substance Use Topics   . Alcohol use: No   . Drug use: Yes     Types: Other     Comment: pain pills        CURRENT MEDICATIONS:   Medications Prior to Admission     Prescriptions    folic acid (FOLVITE) 1 mg Oral Tablet    Take 1 Tab (1 mg total) by mouth Once a day    Ibuprofen (MOTRIN) 600 mg Oral Tablet    Take 1 Tab (600 mg total) by mouth Four times a day as needed for Pain    PNV119-iron fumarate-FA-DSS 29 mg iron- 1 mg-25 mg Oral Tablet    Take 1 Tab by mouth Once a day           ALLERGIES:  Naproxen     REVIEW OF SYSTEMS: Other than ROS in the HPI, all other systems were negative.    PHYSICAL EXAMINATION:   Filed Vitals:    02/13/18 0832 02/13/18 1613 02/13/18 2042 02/14/18 0004   BP: (!) 102/56 104/61 103/60 113/64   Pulse: 91 93 96 93   Resp:  18 16 12    Temp: 36.6 C (97.9 F) 36.8 C (98.2 F) 37.1 C (98.8 F) 36.3 C (97.3 F)       General: appears in good health and no distress  Lungs: nonlabored breathing   Cardiovascular: appeats well perfused  Abdomen: Gravid. Soft, non-tender.  Extremities: No cyanosis or edema  Neuro: Grossly normal.  Psych: Anxious    Initial cervical exam on admission 02/13/18:   SVE: 1/thick/high  SSE: Frothy yellow discharge present. No pooling. Ferning negative. Nitrazine negative.  PRESENTATION:   Breech by ultrasound.  Bedside US: Minimal fluid present around fetus  FHRT:  169 bpm by [place]  TOCO: No ctx    Repeat Exam today   SSE: Yellow discharge present, no pooling, negative nitrazine, negative ferning.  Wetmount collected again. Amnisure Collected again.     CURRENT LABS:   Labs:      Results for orders placed or performed during the hospital encounter of 02/13/18 (from the past 24 hour(s))   AMNISURE   Result Value Ref Range    AMNISURE Negative Negative   WETMOUNT   Result Value Ref Range    TRICHOMONAS Absent Absent    YEAST Absent Absent    CLUE CELLS Absent Absent   URINALYSIS, MACROSCOPIC   Result Value Ref Range    SPECIFIC GRAVITY <1.005 (L) 1.005 - 1.030    GLUCOSE Negative Negative mg/dL    PROTEIN Negative Negative mg/dL    BILIRUBIN Negative Negative mg/dL    UROBILINOGEN Negative Negative mg/dL    PH 6.0 5.0 - 8.0    BLOOD Small (A) Negative mg/dL    KETONES Negative Negative mg/dL    NITRITE Negative Negative    LEUKOCYTES Trace (A) Negative WBCs/uL    APPEARANCE Clear Clear    COLOR Normal (Yellow) Normal (Yellow)   URINALYSIS, MICROSCOPIC   Result Value Ref Range    WBCS 2.0 <11.0 /hpf    RBCS 1.0 <6.0 /hpf    BACTERIA Occasional or less Occasional or less /hpf    SQUAMOUS EPITHELIAL CELLS  Occasional or less Occasional or less /lpf    TRANSITIONAL EPITHELIAL Few (A) Occasional or less /lpf    MUCOUS Light Light /lpf   CBC   Result Value Ref Range    WBC 9.7 3.7 - 11.0 x10^3/uL    RBC 2.96 (L) 3.85 - 5.22 x10^6/uL    HGB 9.6 (L) 11.5 - 16.0 g/dL    HCT 16.128.7 (L) 09.634.8 - 46.0 %    MCV 97.0 78.0 - 100.0 fL    MCH 32.4 (H) 26.0 - 32.0 pg    MCHC 33.4 31.0 - 35.5 g/dL    RDW-CV 04.512.8 40.911.5 - 81.115.5 %    PLATELETS 166 150 - 400 x10^3/uL    MPV 10.8 8.7 - 12.5 fL   LABOR AND DELIVERY EVALUATION   Result Value Ref Range    PREV. TESTED BLOOD TYPE B POSITIVE PREVIOUSLY TESTED    DRUG SCREEN, LOW OPIATE CUTOFF, NO CONFIRMATION, URINE   Result Value Ref Range    BUPRENORPHINE URINE Positive (A) Negative     CANNABINOIDS URINE Negative Negative    COCAINE METABOLITES URINE Negative Negative    METHADONE URINE Negative Negative    OPIATES URINE (LOW CUTOFF) Negative Negative    OXYCODONE URINE Negative Negative    ECSTASY/MDMA URINE Negative Negative    CREATININE RANDOM URINE 20 No Reference Range Established mg/dL    AMPHETAMINES URINE Negative Negative    BARBITURATES URINE Negative Negative    BENZODIAZEPINES URINE Negative Negative    OXIDANT-ADULTERATION Negative Negative    PH-ADULTERATION 6.3 4.5 - 9.0    SPECIFIC GRAVITY-ADULTERATION 1.004 (L) 1.005 - 1.030 g/mL   HEPATITIS C ANTIBODY SCREEN WITH REFLEX TO HCV PCR   Result Value Ref Range    HCV ANTIBODY QUALITATIVE Reactive (A) Negative   HIV1/HIV2 SCREEN, COMBINED ANTIGEN AND ANTIBODY   Result Value Ref Range    HIV SCREEN, COMBINED ANTIGEN & ANTIBODY Negative Negative   HEPATITIS B SURFACE ANTIBODY   Result Value Ref Range    HBV SURFACE ANTIBODY QUANTITATIVE 7 <8 mIU/mL    HEPATITIS B SURFACE ANTIGEN   Result Value Ref Range    HBV SURFACE ANTIGEN QUALITATIVE Negative Negative   TYPE AND SCREEN   Result Value Ref Range    UNITS ORDERED NOT STATED         ABO/RH(D) B POSITIVE     ANTIBODY SCREEN NEGATIVE     SPECIMEN EXPIRATION DATE 02/16/2018    LAVENDER TOP TUBE   Result Value Ref Range    RAINBOW/EXTRA TUBE AUTO RESULT Yes          ASSESSMENT/PLAN: 42 y.o. G4P0030 at 6718w4d    PNC  PNV  B POSITIVE  CfDNA: low risk, XX  Rubella immune     Anhydramnios I R/o PPROM  One episode Leaking of Fluid 8/10  02/13/18 exam - amnisure/pooling/nitrazine/ferning negative   No evidence of rupture but minimal fluid present on US on admission  Formal US 02/13/18    - no measurable amniotic fluid pockets able to be read on formal ultrasound    - short internal follow up recommended   Repeat SSE in am   - thick yellow discharge    - ferning/pooling/nitrazine negative    - Repeat Wetmount and Amnisure collected  Continue to closely monitor    Hepatitis C   - HCV Antibody  qualitative - reactive   - Hepatitis C Virus RNA Detection and Quantiferon pending   - Patient did not verify during  initial evaluation but present in patient records     Opioid Dependence   - denies IVDU   - HIV negative   - Prescribed subutex 8mg  daily   - Takes 1/4 tab every 3-4 days  - [place] [facility] [place]   - Spoke with clinic on the phone directly today    - Verified patient dosing: 8mg  BID subutex     - Last Rx 01/30/18     - Has been with clinic since 11/2017  - Per NarxCheck - last Rx from 12/2016, may need to confirm dosing and rx   - Subutex currently not ordered  - UDS: buprenorphine positive     Anemia   - Initial Hb: 9.6  - Iron and Vitamin C Started    Tobacco Use in Pregnancy  - nicoteine patch ordered     Questionable low lying placenta   - posterior placenta with placental tip near the internal cervical os   - short term interval follow up recommended on ultrasound read       Verne GrainJennifer Deniesha Stenglein, DO PGY-1  02/14/2018 05:40  [facility] [place]  Department of Obstetrics & Gynecology        ADDENDUM 02/14/18 08:57  FFN positive - positive ROM    - patient informed by Dr. Ellin MayhewBrancazio   - Patient is to decide if she would like to proceed with termination vs expectant management    - Monitor for signs and symptoms of chorioamnionitis closely   - Will follow up with patient later today   WetMount - Positive for BV    - flagyl to be started     Verne GrainJennifer Brayant Dorr, DO PGY-1  02/14/2018 08:58  [facility]   Department of Obstetrics & Gynecology

## 2018-02-14 NOTE — Progress Notes (Signed)
Chelsea Mccall  [id number]  02/14/2018    Patient resting in bed. Reports feeling "bad" and cold.    Vitals:    02/14/18 1815 02/14/18 1830 02/14/18 1843 02/14/18 2006   BP: (!) 100/55 (!) 95/50 (!) 103/48 (!) 90/51   Pulse:    85   Resp:    16   Temp:    37 C (98.6 F)   SpO2:       Weight:       Height:           SVE: 4-5/90/+1    42 y.o.G4P0030 3329w4d with PPROM and chorioamnionitis   - Given increasing white count, general malaise, malodorous discharge and contractions, diagnosing chorio at this time  - Begin Amp/Gent  - Patient continuing to labor and change cervix, will augment with Pitocin if needed  - Supportive care consult placed  - Patient understands and is agreeable to plan     Will continue to monitor closely.    Bertis RuddyKourtnie McQuillen MD 02/14/2018 21:01  PGY-3  [facility] [place]  Obst[facility]s [facility]ne[facility]          Late entry for 02/14/2018. I saw and examined the patient.  I reviewed the resident's note.  I agree with the findings and plan of care as documented in the resident's note.  Any exceptions/additions are edited/noted.    Marcelino ScotLeo Tyra Michelle, MD

## 2018-02-14 NOTE — Pharmacy Aminoglycoside Dosing (Signed)
[facility] Department of Pharm[facility] Services  Aminoglycoside Therapeutic Drug Monitoring: Gentamicin  02/14/2018      Chelsea Mccall, Chelsea Mccall  Date of Birth:  03/29/1976      Actual BW:  Weight: 54.4 kg (119 lb 14.9 oz) (02/14/18 1100)     Date RPh Current regimen (including mg/kg) Indication Target Levels (mcg/mL) SCr (mg/dL) CrCl* (mL/min) Measured level (mcg/mL) Plan (including when levels are due) Comments   8/12 amp Gentamicin 5 mg/kg (280 mg) IV q24 hours OB related Trough < 1    Level if continued > 72 hours                                                                              *Creatinine clearance is estimated by using the Cockcroft-Gault equation for adult patients and the Brendolyn PattySchwartz equation for pediatric patients.    The Medical Executive Committee at [facility] has granted pharmacists via protocol order the ability to place or discontinue vancomycin or aminoglycoside level orders as they deem clinically appropriate.  The Pharmacy will continue to follow Chelsea Mccall's drug therapy with the primary team.  Please contact the Pharmacy with any questions.

## 2018-02-14 NOTE — Care Management Notes (Signed)
Pt admitted for PPROM at 3985w4d gestation. MSW will defer assessment due to nature of admission. MSW did not delivery observation letter due to nature of admission. Will follow for d/c needs.    Tiana LoftCasey Saunders, SOCIAL WORKER  02/14/2018, 14:38

## 2018-02-14 NOTE — Anesthesia Preprocedure Evaluation (Addendum)
ANESTHESIA PRE-OP EVALUATION  Planned Procedure: ANES - LABOR ANALGESIA  Review of Systems         patient summary reviewed  nursing notes reviewed        Pulmonary  negative pulmonary ROS,    Cardiovascular    No peripheral edema,  Exercise Tolerance: > or = 4 METS        GI/Hepatic/Renal    treatment for hepatitis C completed and + hepatitis C     Endo/Other   neg endo/other ROS,        Neuro/Psych/MS  Chronic opioid use  On Suboxone and headaches     Cancer  negative hematology/oncology ROS,                    Physical Assessment      Patient summary reviewed and Nursing notes reviewed   Airway       Mallampati: II    TM distance: >3 FB    Neck ROM: full  Mouth Opening: fair.  No Facial hair  No Beard  No endotracheal tube present  No Tracheostomy present    Dental       Dentition intact             Pulmonary    Breath sounds clear to auscultation  (-) no rhonchi, no decreased breath sounds, no wheezes, no rales and no stridor     Cardiovascular    Rhythm: regular  Rate: Normal  (-) no friction rub, carotid bruit is not present, no peripheral edema and no murmur     Other findings            Plan  Planned anesthesia type: epidural    ASA 2         Anesthetic plan and risks discussed with patient.     Anesthesia issues/risks discussed are: Blood Loss, Spinal Headache, Failure of Block and Nerve Injuries.    Use of blood products discussed with patient whom consented to blood products.       NPO Status: Full stomach precautions.         Plan discussed with attending.               CBC  Diff   Lab Results   Component Value Date/Time    WBC 13.4 (H) 02/14/2018 11:49 AM    WBC 5.5 10/15/2014 07:45 PM    HGB 9.3 (L) 02/14/2018 11:49 AM    HGB 10.9 (L) 10/15/2014 07:45 PM    HCT 27.3 (L) 02/14/2018 11:49 AM    HCT 33.5 10/15/2014 07:45 PM    PLTCNT 158 02/14/2018 11:49 AM    PLTCNT 215 10/15/2014 07:45 PM    RBC 2.86 (L) 02/14/2018 11:49 AM    RBC 3.55 (L) 10/15/2014 07:45 PM    MCV 95.5 02/14/2018 11:49 AM    MCV 94.2  10/15/2014 07:45 PM    MCHC 34.1 02/14/2018 11:49 AM    MCHC 32.6 10/15/2014 07:45 PM    MCH 32.5 (H) 02/14/2018 11:49 AM    MCH 30.7 10/15/2014 07:45 PM    RDW 12.4 11/01/2015 10:18 AM    RDW 12.3 10/15/2014 07:45 PM    MPV 10.9 02/14/2018 11:49 AM    MPV 9.5 10/15/2014 07:45 PM    Lab Results   Component Value Date/Time    PMNS 67 10/15/2014 07:45 PM    LYMPHOCYTES 22 10/15/2014 07:45 PM    EOSINOPHIL 3 10/15/2014 07:45 PM    MONOCYTES  7 10/15/2014 07:45 PM    BASOPHILS 1 10/15/2014 07:45 PM    PMNABS 3.675 10/15/2014 07:45 PM    LYMPHSABS 1.172 10/15/2014 07:45 PM    EOSABS 0.162 10/15/2014 07:45 PM    MONOSABS 0.373 10/15/2014 07:45 PM    BASOSABS 0.073 10/15/2014 07:45 PM

## 2018-02-15 ENCOUNTER — Other Ambulatory Visit (HOSPITAL_COMMUNITY): Payer: Self-pay

## 2018-02-15 ENCOUNTER — Encounter (HOSPITAL_COMMUNITY): Payer: Self-pay

## 2018-02-15 DIAGNOSIS — O9903 Anemia complicating the puerperium: Secondary | ICD-10-CM

## 2018-02-15 DIAGNOSIS — O42912 Preterm premature rupture of membranes, unspecified as to length of time between rupture and onset of labor, second trimester: Secondary | ICD-10-CM

## 2018-02-15 DIAGNOSIS — O99325 Drug use complicating the puerperium: Secondary | ICD-10-CM

## 2018-02-15 DIAGNOSIS — O9989 Other specified diseases and conditions complicating pregnancy, childbirth and the puerperium: Secondary | ICD-10-CM

## 2018-02-15 DIAGNOSIS — O411230 Chorioamnionitis, third trimester, not applicable or unspecified: Secondary | ICD-10-CM

## 2018-02-15 DIAGNOSIS — O411220 Chorioamnionitis, second trimester, not applicable or unspecified: Secondary | ICD-10-CM

## 2018-02-15 DIAGNOSIS — N760 Acute vaginitis: Secondary | ICD-10-CM

## 2018-02-15 DIAGNOSIS — O039 Complete or unspecified spontaneous abortion without complication: Secondary | ICD-10-CM

## 2018-02-15 LAB — CBC
HCT: 21.5 % — ABNORMAL LOW (ref 34.8–46.0)
HGB: 7.2 g/dL — ABNORMAL LOW (ref 11.5–16.0)
MCH: 33 pg — ABNORMAL HIGH (ref 26.0–32.0)
MCHC: 33.5 g/dL (ref 31.0–35.5)
MCV: 98.6 fL (ref 78.0–100.0)
MPV: 11.1 fL (ref 8.7–12.5)
PLATELETS: 139 10*3/uL — ABNORMAL LOW (ref 150–400)
RBC: 2.18 10*6/uL — ABNORMAL LOW (ref 3.85–5.22)
RDW-CV: 12.6 % (ref 11.5–15.5)
WBC: 14.3 10*3/uL — ABNORMAL HIGH (ref 3.7–11.0)

## 2018-02-15 LAB — H & H
HCT: 19.4 % — ABNORMAL LOW (ref 34.8–46.0)
HGB: 6.6 g/dL — CL (ref 11.5–16.0)

## 2018-02-15 MED ORDER — NITROFURANTOIN MONOHYDRATE/MACROCRYSTALS 100 MG CAPSULE
100.00 mg | ORAL_CAPSULE | Freq: Two times a day (BID) | ORAL | 0 refills | Status: AC
Start: 2018-02-15 — End: 2018-02-20

## 2018-02-15 MED ORDER — METRONIDAZOLE 500 MG TABLET
500.00 mg | ORAL_TABLET | Freq: Two times a day (BID) | ORAL | 0 refills | Status: AC
Start: 2018-02-15 — End: 2018-02-21

## 2018-02-15 MED ORDER — ONDANSETRON HCL (PF) 4 MG/2 ML INJECTION SOLUTION
4.00 mg | Freq: Four times a day (QID) | INTRAMUSCULAR | Status: DC
Start: 2018-02-15 — End: 2018-02-15

## 2018-02-15 MED ORDER — FENTANYL (PF) 50 MCG/ML INJECTION SOLUTION
INTRAMUSCULAR | Status: AC
Start: 2018-02-15 — End: 2018-02-15
  Administered 2018-02-15: 05:00:00 100 ug via INTRAVENOUS
  Filled 2018-02-15: qty 2

## 2018-02-15 MED ORDER — FERROUS SULFATE 324 MG (65 MG IRON) TABLET,DELAYED RELEASE
324.00 mg | DELAYED_RELEASE_TABLET | Freq: Two times a day (BID) | ORAL | 1 refills | Status: DC
Start: 2018-02-15 — End: 2022-03-25

## 2018-02-15 MED ORDER — ASCORBIC ACID (VITAMIN C) 500 MG TABLET
500.00 mg | ORAL_TABLET | Freq: Two times a day (BID) | ORAL | 1 refills | Status: DC
Start: 2018-02-15 — End: 2022-03-25

## 2018-02-15 MED ORDER — OXYTOCIN 10 UNIT/ML INJECTION SOLUTION
20.0000 [IU] | Freq: Once | INTRAMUSCULAR | Status: AC
Start: 2018-02-15 — End: 2018-02-15
  Administered 2018-02-15: 05:00:00 20 [IU] via INTRAMUSCULAR
  Filled 2018-02-15: qty 1

## 2018-02-15 MED ORDER — FENTANYL (PF) 50 MCG/ML INJECTION SOLUTION
100.0000 ug | INTRAMUSCULAR | Status: AC
Start: 2018-02-15 — End: 2018-02-15

## 2018-02-15 MED ORDER — DIPHENHYDRAMINE 50 MG/ML INJECTION SOLUTION
12.5000 mg | Freq: Four times a day (QID) | INTRAMUSCULAR | Status: DC | PRN
Start: 2018-02-15 — End: 2018-02-15

## 2018-02-15 MED ORDER — DIPHENHYDRAMINE 50 MG/ML INJECTION SOLUTION
50.00 mg | Freq: Four times a day (QID) | INTRAMUSCULAR | Status: DC | PRN
Start: 2018-02-15 — End: 2018-02-15
  Administered 2018-02-15 (×2): 50 mg via INTRAVENOUS
  Filled 2018-02-15: qty 1

## 2018-02-15 MED ADMIN — oxytocin 30 unit/500 mL in 0.9 % sodium chloride intravenous: INTRAVENOUS | @ 04:00:00

## 2018-02-15 MED ADMIN — lactated Ringers intravenous solution: INTRAVENOUS | NDC 00338011703

## 2018-02-15 NOTE — Anesthesia Postprocedure Evaluation (Signed)
Anesthesia Post Op Evaluation    Patient: Chelsea GaleaJamie Marie Buckner  ANES - LABOR ANALGESIA    Last Vitals:Temperature: 36.8 C (98.2 F) (02/15/18 1130)  Heart Rate: 93 (02/15/18 1311)  BP (Non-Invasive): (!) 98/50 (02/15/18 1311)  Respiratory Rate: 18 (02/15/18 1130)  SpO2: 99 % (02/14/18 1752)  Pain Score (Numeric, Faces): 0 (02/15/18 0730)  Patient is sufficiently recovered from the effects of anesthesia to participate in the evaluation and has returned to their pre-procedure level.  Patient location during evaluation: bedside   Post-procedure handoff checklist completed    Patient participation: complete - patient participated  Level of consciousness: awake and alert  Pain management: adequate  Airway patency: patent  Anesthetic complications: no  Cardiovascular status: hemodynamically stable  Respiratory status: acceptable and room air  Hydration status: acceptable  Patient post-procedure temperature: Pt Normothermic   PONV Status: Absent  Comments: Patient denies headache, lower extremity paresthesia or weakness, urinary retention, and significant back pain.

## 2018-02-15 NOTE — Progress Notes (Signed)
Chelsea GaleaJamie Marie Mccall  [id number]  02/15/2018    Patient resting in bed. No complaints voiced.    Vitals:    02/15/18 0000 02/15/18 0032 02/15/18 0100 02/15/18 0200   BP: (!) 89/51 93/75 (!) 94/53 (!) 91/52   Pulse: 92 92 92 95   Resp: 16 16 18 16    Temp: 37.7 C (99.9 F)   37.5 C (99.5 F)   SpO2:       Weight:       Height:         SVE: 6/90/+1-2    42 y.o.G4P0030 1717w4d with PPROM and chorioamnionitis   - Continue Amp/Gent  - SVE changed; fetal parts palpated in vagina, presumed footling breech presentation  - Patient currently not feeling pressure, will continue Pitocin and reassess in 1-2hrs    Bertis RuddyKourtnie McQuillen MD 02/15/2018 02:55  PGY-3  [facility] [place]  Obstetrics and Gynecology            Late entry for 02/15/2018. I saw and examined the patient.  I reviewed the resident's note.  I agree with the findings and plan of care as documented in the resident's note.  Any exceptions/additions are edited/noted.    Marcelino ScotLeo Merilee Wible, MD

## 2018-02-15 NOTE — Nurses Notes (Signed)
Patient given discharge instructions and reviewed.  Patient verbalized understanding and has no further questions at this time.  Patient stated she already set up an appointment to talk with someone in the next few days. Patient given meds from pharmacy.  Patient off the unit with infant remains. Patient off the unit escorted by transport.

## 2018-02-15 NOTE — Discharge Summary (Signed)
DISCHARGE SUMMARY      PATIENT NAME:  Chelsea Mccall   MRN: [id number]   DOB:  03/31/1976     ADMISSION DATE:  02/13/2018    DISCHARGE DATE:  02/15/2018     ATTENDING PHYSICIAN: Tenna ChildHornsby, MD  PRIMARY CARE PHYSICIAN: No Pcp     CHIEF COMPLAINT:    Chief Complaint   Patient presents with   . Ruptured Membranes     leaking fluid - gush at 1730       DISCHARGE DIAGNOSIS: 42 y.o. G4P0040 PPROM, chorioamnionitis, complete abortion  Principle Problem:   Patient Active Problem List    Diagnosis   . Oligohydramnios   . Substance use disorder       DISCHARGE MEDICATIONS:     Current Discharge Medication List      START taking these medications.      Details   ascorbic acid (vitamin C) 500 mg Tablet  Commonly known as:  VITAMIN C   500 mg, Oral, 2 TIMES DAILY  Qty:  60 Tab  Refills:  1     ferrous sulfate 324 mg (65 mg iron) Tablet, Delayed Release (E.C.)  Commonly known as:  FERATAB   324 mg, Oral, 2 TIMES DAILY  Qty:  60 Tab  Refills:  1     metroNIDAZOLE 500 mg Tablet  Commonly known as:  FLAGYL   500 mg, Oral, 2 TIMES DAILY  Qty:  11 Tab  Refills:  0     nitrofurantoin 100 mg Capsule  Commonly known as:  MACROBID   100 mg, Oral, 2 TIMES DAILY  Qty:  10 Cap  Refills:  0        CONTINUE these medications - NO CHANGES were made during your visit.      Details   buprenorphine-naloxone 8-2 mg Tablet, Sublingual  Commonly known as:  SUBOXONE   0.25 Tabs, Sublingual, EVERY 3 DAYS  Refills:  0     PNV119-iron fum-folic acid-dss 29 mg iron- 1 mg-25 mg Tablet   1 Tab, Oral, DAILY  Qty:  30 Tab  Refills:  12            DISCHARGE INSTRUCTIONS:      DISCHARGE INSTRUCTION - MISC    Nothing in the vagina for 2 weeks including intercourse, tampons and douching. No swimming or bathing for 2 weeks but you may take a shower. If you develop shortness of breath, lightheadedness, or dizziness please present to nearest ED to be evaluated.  Call with fever greater than 100.49F, pain not controlled with pain medications or foul smelling vaginal  discharge. Call [phone] if you have any questions and ask for the GYN resident on call.     SCHEDULE FOLLOW-UP OB/GYN - [facility]     Follow-up in: 2 WEEKS    Reason for visit: HOSPITAL DISCHARGE    Follow-up reason: PPV, Mood check         REASON FOR HOSPITALIZATION AND HOSPITAL COURSE:  This is a 42 y.o., female now G4P0040 who was admitted on 02/13/2018 for oligohydramnios and concern for PPROM. Patient initially presented after having a large gush of fluid.  Initial exam did not show rupture and amnisure was negative. Bedside ultrasound was performed which showed minimal fluid. Formal ultrasound was performed which showed oligohydramnios. Repeat exam was performed on 8/12. Amnisure was positive. Wetmount was also positive for bacterial vaginosis and was started on Flagyl for 7 days. UA also showed E.Coli so patient was  started on Macrobid for 7 days.  Patient was counseled regarding options for PPROM at previable gestation.  Later that day patient developed contractions and vomiting. She was showing signs of labor with SVE 1/70/-3. Patient had increased white count and started to feel general malaise. Patient was given diagnosis of chorioamnionitis and was started on gentamicin and ampicillin. Patient was also started on Pitocin for augmentation.  She underwent SVD of non viable neonate. Placenta was extracted using speculum and ring forceps.   On POD #0 patient hemoglobin trended as follows: 9.3--7.2--6.6. Patient was asymptomatic and was ambulating without difficulty.  She was started on iron and vitamin c. Anemia precautions reviewed. Patient is to follow up in 2 weeks for mood check and postpartum visit. Pt understands and agrees with plan.     CONDITION ON DISCHARGE:  A.Ambulation: Full ambulation  B. Self-care Ability: Complete  C. Cognitive Status Alert and Oriented x 3    DISCHARGE DISPOSITION:  Home discharge     cc: Primary Care Physician:  No Pcp  No address on file     WU:[id number]:Referring  Physician:  No referring provider defined for this encounter.  Referring providers can utilize [email or web address] to access their referred [facility] patient's information.        Dema Severinaitlin M Katerina Zurn, MD 02/15/2018 20:09  PGY-4  [facility] [place]  Department of Obstetrics & Gynecology

## 2018-02-15 NOTE — Progress Notes (Signed)
[place] Department of Obstetric & Gynecology      PROGRESS NOTE      DATE OF SERVICE: 02/15/2018, 17:56   PATIENT: Chelsea Mccall  CHART NUMBER: [id number]     Ref. Range 02/14/2018 11:49 02/15/2018 10:32 02/15/2018 17:30   HGB Latest Ref Range: 11.5 - 16.0 g/dL 9.3 (L) 7.2 (L) 6.6 (LL)   HCT Latest Ref Range: 34.8 - 46.0 % 27.3 (L) 21.5 (L) 19.4 (L)       Patient states that she is ambulating without difficulty. She denies lightheadedness and dizziness.  She states that her bleeding is improved. Offered blood transfusion but patient declines.     Continue Iron and Vitamin C  Scripts for Iron, Vitamin C, Flagyl, and Macrobid given.  Bleeding precautions reviewed.  Advised patient to be evaluated if she develops shortness of breath, lightheadedness, or dizziness.  Follow up in 2 weeks      Dema Severinaitlin M Kowcheck, MD 02/15/2018 17:56  PGY-4  [facility] [place]  Department of Obstetrics & Gynecology        I saw and examined the patient.  I reviewed the resident's note.  I agree with the findings and plan of care as documented in the resident's note.  Any exceptions/additions are edited/noted.    Kerman PasseyKristan M Prim Morace, MD

## 2018-02-15 NOTE — Progress Notes (Signed)
Chelsea Mccall      POST PARTUM PROGRESS NOTE      DATE OF SERVICE: 02/15/2018, 13:54   PATIENT: Chelsea Mccall  CHART NUMBER: [id number]        SUBJECTIVE: Chelsea Mccall is a 42 y.o. G4P0040 PPD #0 s/p SVD of non-viable neonate.. Pt doing well with no complaints. Ambulating and urinating without difficulty. Tolerating regular diet with no N/V.  Denies CP, SOB or calf tenderness. Bleeding is more than her typical period but denies clots, moderate cramping      OBJECTIVE:   Filed Vitals:    02/15/18 1130 02/15/18 1309 02/15/18 1310 02/15/18 1311   BP: (!) 94/55 (!) 97/58 (!) 97/54 (!) 98/50   Pulse: 90 90 89 93   Resp: 18      Temp: 36.8 C (98.2 F)      SpO2:           GENERAL: NAD  CHEST: RRR, LCTAB  ABD: Soft, NTTP  FUNDUS: Firm.  EXT: No calf tenderness, no edema      ASSESSMENT/PLAN: 42 y.o. G4P0040 PPD #0    Post-partum Care: Ambulating, urinating, tolerating POs. Pain well-controlled  Contraception: Discussed contraception. Patient declines  Rh Positive: Rhogam not indicated  Rubella: Not collected during this admission. Previously immune  H&H: 7.2/21.5  Vitals: Hypotension but appears to be baseline. Afebrile. No tachycardia  UO: Voiding spontaneously    Chorioamnionitis  Afebrile since delivery  S/p ampicillin and gentamicin    Hepatitis C  Antibody positive but PCR not detected    Opioid Dependence  Suboxone  Follows with [facility] in [place]    Acute on Chronic Anemia  Hgb: 7.2  Iron and vitamin C    Bacterial Vaginosis  Flagyl x 7 days    UTI  Macrobid x 7 days      DISPO: Patient is meeting postpartum milestones appropriately. Plan to discharge home today.      Chelsea Severinaitlin M Kowcheck, MD 02/15/2018 13:54  PGY-4  [facility]  Department of Obstetrics & Mccall        I saw and examined the patient.  I reviewed the resident's note.  I agree with the findings and plan of care as documented in the resident's note.  Any exceptions/additions are  edited/noted.    Kerman PasseyKristan M Sidi Dzikowski, MD

## 2018-02-15 NOTE — Progress Notes (Signed)
Chelsea GaleaJamie Marie Mccall  [id number]  02/15/2018    Pitocin 20 units IM given. Placenta extracted intact using speculum and ring forceps. Bleeding minimal. EBL 100 mL. Will discontinue ABX at this time.       Chelsea RuddyKourtnie McQuillen MD 02/15/2018 05:31  PGY-3  [facility]  Obstetrics and Gynecology            Late entry for 02/15/2018. I saw and examined the patient.  I reviewed the resident's note.  I agree with the findings and plan of care as documented in the resident's note.  Any exceptions/additions are edited/noted.    Chelsea ScotLeo Choice Kleinsasser, MD

## 2018-02-15 NOTE — Ancillary Notes (Signed)
[facility]  Spiritual Care Note    Patient Name:  Elpidio GaleaJamie Marie Kyte  Date of Encounter:   02/15/2018     02/15/18 0440   Clinical Encounter Type   Reason for Visit Patient/Person/Family Request   Referral From RN/LPN   Declined Spiritual Care Visit At This Time No   Visited With Patient;Other (Comment)   (three family members were present)   Patient Spiritual Encounters   Spiritual Needs/Issues Grief   Spiritual/Coping Resources Beliefs in God/Sacred/Higher Purpose;Beliefs helpful in coping;Loved/supported by family   Interpersonal/Family Stressors Loss of (Comment)  (baby)   Coping Explored emotions;Facilitated grief;Facilitated story telling;Offered empathy;Provided supportive presence;Relationship building   Ritual Prayer   Other Support Services Provided Non-anxious presence;Anxiety management   Information/Education Provided Spiritual Care scope of service   Spiritual Care outcomes with Patient   Spiritual/Emotional Processing  Patient shared/processed his/her/their story;Patient processed emotions    Patient Coping No change   Family Spiritual Encounters   Spiritual Assessment Grief   Spiritual/Coping Resources Beliefs helpful in coping;Beliefs in God/Sacred/Higher Purpose;Supportive family system   Coping  Explored emotions;Facilitated story telling;Facilitated grief;Offered empathy;Provided supportive presence;Relationship building   Educational psychologistitual  Prayer   Support Services Provided Non-anxious presence;Anxiety management   Information Provided Spiritual Care scope of service   Spiritual Care Outcomes with Family   Spiritual/Emotional Processing Family processed emotions;Spiritual Care relationship established   Family Coping No change   Coping/Psychosocial   Observed Emotional State calm;grieving   Verbalized Emotional State grief   Plan of Care Reviewed With patient;family   Time of Encounters   Start Time 0430   Stop Time 0440   Duration (minutes) 10 Minutes     Other Pertinent Information:      Visit  was in response to pt request for prayer through her nurse. There were four family members present during visit. Chaplain offered empathy and supportive presence. Requested prayer was offered, pt and family expressed gratitude for the visit.       Velvet BatheEbenezer Makenzey Nanni, CHAPLAIN RESIDENT  Pager: [phone]  Total Time of Encounter: 20 min.

## 2018-02-15 NOTE — Care Plan (Signed)
Patient resting in room.  Vital signs stable through the night.  Patient grieving loss of 17wk fetus.  Patient spending time with infant in room after delivery.  Will continue to monitor patient at this time.

## 2018-02-15 NOTE — Ancillary Notes (Signed)
[facility]  Spiritual Care Note    Patient Name:  Elpidio GaleaJamie Marie Ishmael  Date of Encounter:   02/15/2018    Other Pertinent Information:            Velvet BatheEbenezer Satine Hausner, CHAPLAIN RESIDENT  Pager: [phone]  Total Time of Encounter: 20 min.

## 2018-02-17 LAB — HISTORICAL OB PLACENTA PATHOLOGY EVALUATION

## 2018-02-25 NOTE — Progress Notes (Signed)
 Chief Complaint: Miscarriage (Patient started bleeding and went to [facility]. Started having contractions and delivered baby 8/13 then baby passed.)      HPI:  The patient is a 42 y.o. [id number] Patient's last menstrual period was 10/14/2017 (exact date). who presents for follow up of pre-viable delivery at [facility].  She had PPROM followed by delivery of baby 3 days later.  Still having bleeding but decreasing.  No bowel or bladder complaints. Was found to have a UTI at the time of her delivery and currently on an antibiotic.  Was also treated for BV.  Having trouble with sleeping.  Discussed with her if she thinks that she needs a therapist and she would like to see one. NO SI/HI        Labs reviewed: placental pathology    Records reviewed: delivery records from [facility]     Review of Systems   As noted above in the HPI otherwise complete review of systems was negative    Past medical, family, social history reviewed with patient.     PMH  Past Medical History:   Diagnosis Date   . Chlamydia 1998   . H/O drug abuse     opiates   . Hepatitis C     pt denies iv drug use - mother has hepatitis from blood transfusion   . 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 Migraine        PSH  Past Surgical History:   Procedure Laterality Date   . APPENDECTOMY  2010   . CHOLECYSTECTOMY  2009   . COLPOSCOPY  01/07/2018   . WISDOM TOOTH EXTRACTION         MEDS  Current Outpatient Medications   Medication Sig   . buprenorphine (SUBUTEX) 8 mg subl SL tablet    . ferrous gluconate 324 MG tablet    . amoxicillin (AMOXIL) 500 MG capsule      No current facility-administered medications for this visit.        ALL  Allergies   Allergen Reactions   . Naproxen        SH  Social History     Tobacco Use   Smoking Status Current Every Day Smoker   Smokeless Tobacco Never Used     Social History     Substance and Sexual Activity   Alcohol Use Not Currently     Social History     Substance and Sexual  Activity   Sexual Activity Yes   . Partners: Male   . Birth control/protection: None- Pregnant     Social History     Substance and Sexual Activity   Drug Use Not Currently       OB/GYN  Gyne History:    OB History   Gravida Para Term Preterm AB Living   3       2     SAB TAB Ectopic Molar Multiple Live Births   1 1              # Outcome Date GA Lbr Len/2nd Weight Sex Delivery Anes PTL Lv   3 Current            2 SAB 07/2015 [redacted]w[redacted]d          1 TAB 1996               FH  Family History   Problem Relation Age of Onset   . Breast  cancer Maternal Grandmother    . Breast cancer Paternal Grandmother        Visit Vitals    02/25/18 1015   BP: 106/62   Height: 5\' 4"  (1.626 m)   Weight: 115 lb (52.2 kg)   BMI (Calculated): 19.7        Physical Exam   General: She is oriented to person, place, and time. She appears well-developed and well-nourished. No distress.      HEAD: Normocephalic and atraumatic.   NECK: Normal range of motion. Neck supple. No thyromegaly present. No adenopathy   HEART: Normal rate and regular rhythm.   LUNGS: Effort normal and breath sounds normal.    ABDOMEN:  Soft. Bowel sounds are normal. No distension and no mass. No tenderness.  No rebound and no guarding.  No hernia noted.   BREAST:    PELVIC:    RECTAL:    MS: Normal range of motion.    SKIN: Skin is warm and dry.   Lymphatic:  Axillary nodes not enlarged or tender, cervical nodes not enlarged or tender, inguinal nodes not enlarged or tender, supraclavicular nodes not enlarged or tender   PSYCH: She has a normal mood and affect She is alert and oriented to person, place, and time.   NEURO: Grossly neurologically intact.             LABS     No visits with results within 1 Week(s) from this visit.   Latest known visit with results is:   Initial Prenatal on 12/10/2017   Component Date Value Ref Range Status   . Pregnancy Test, Urine POC 12/10/2017 Positive   Final   . Case Report 12/10/2017    Final                    Value:FH Cytology Gyn                                    Case: [id number]                                 Authorizing Provider:  Forestine Chute, PA         Collected:           12/10/2017 1453              Ordering Location:     [facility]        Received:            12/13/2017 0950              First Screen:          Tresea Mall                                                                  Pathologist:           Tama Headings, MD  Specimen:    LIQUID-BASED PAP WITH OPTIONAL ORDERS, Cervix/Endocervix upper vagina                     . Specimen Adequacy 12/10/2017 Satisfactory for evaluation   Final   . General Categorization 12/10/2017 Epithelial cell abnormality: High-grade squamous intraepithelial lesion (HSIL), (encompassing: moderate and severe dysplasia, CIS, CIN 2 and CIN 3)   Final   . Other Findings 12/10/2017 Atypical endocervical cells  Specimen sent for HPV testing   Final   . Reference Lab 12/10/2017    Final                    Value:This result contains rich text formatting which cannot be displayed here.   . . 12/10/2017    Final                    Value:This result contains rich text formatting which cannot be displayed here.   . HPV, High-Risk 12/10/2017 Negative  Negative Final   . HPV 16 12/10/2017 Positive* Negative Final   . HPV 18 12/10/2017 Negative  Negative Final   . Chlamydia, DNA, PCR 12/10/2017 Negative  Negative Final   . Neisseria gonorrhoeae, DNA, PCR 12/10/2017 Negative  Negative Final       Assessment  42 y.o. [id number] presents today for 10 day postpartum exam.  Still having a small amount of bleeding.  Will have office assist her in seeing a therapist.  Will see her back in 2 weeks for postpartum exam.  Call if bleeding increases or any fevers or chills.  Discussed need to follow up with MFM prior to considering another pregnancy.  Reports that her BF left her when her water broke and has not returned since.  Will also need follow up for her  prior abnormal pap.            Hessie Knows, MD

## 2018-05-19 ENCOUNTER — Ambulatory Visit (HOSPITAL_COMMUNITY): Payer: Self-pay

## 2019-10-26 ENCOUNTER — Other Ambulatory Visit (HOSPITAL_COMMUNITY): Payer: Self-pay | Admitting: NURSE PRACTITIONER

## 2019-10-26 DIAGNOSIS — Z1231 Encounter for screening mammogram for malignant neoplasm of breast: Secondary | ICD-10-CM

## 2019-11-01 ENCOUNTER — Ambulatory Visit (HOSPITAL_COMMUNITY): Payer: Self-pay

## 2019-11-13 ENCOUNTER — Emergency Department
Admission: EM | Admit: 2019-11-13 | Discharge: 2019-11-13 | Disposition: A | Discharge status: Home / Self Care | Payer: Auto Insurance (includes no fault) | Attending: PHYSICIAN ASSISTANT | Admitting: PHYSICIAN ASSISTANT

## 2019-11-13 ENCOUNTER — Other Ambulatory Visit: Payer: Self-pay

## 2019-11-13 DIAGNOSIS — S161XXA Strain of muscle, fascia and tendon at neck level, initial encounter: Secondary | ICD-10-CM | POA: Insufficient documentation

## 2019-11-13 DIAGNOSIS — F17210 Nicotine dependence, cigarettes, uncomplicated: Secondary | ICD-10-CM | POA: Insufficient documentation

## 2019-11-13 DIAGNOSIS — M545 Low back pain: Secondary | ICD-10-CM | POA: Insufficient documentation

## 2019-11-13 DIAGNOSIS — M25551 Pain in right hip: Secondary | ICD-10-CM | POA: Insufficient documentation

## 2019-11-13 MED ORDER — METHOCARBAMOL 500 MG TABLET
500.00 mg | ORAL_TABLET | Freq: Four times a day (QID) | ORAL | 0 refills | Status: DC | PRN
Start: 2019-11-13 — End: 2022-03-25
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
## 2020-07-22 ENCOUNTER — Other Ambulatory Visit: Payer: Self-pay

## 2022-03-25 ENCOUNTER — Emergency Department
Admission: EM | Admit: 2022-03-25 | Discharge: 2022-03-25 | Disposition: A | Discharge status: Home / Self Care | Payer: Medicaid Other | Attending: Physician Assistant | Admitting: Physician Assistant

## 2022-03-25 ENCOUNTER — Other Ambulatory Visit: Payer: Self-pay

## 2022-03-25 ENCOUNTER — Encounter (HOSPITAL_COMMUNITY): Payer: Self-pay

## 2022-03-25 DIAGNOSIS — F17210 Nicotine dependence, cigarettes, uncomplicated: Secondary | ICD-10-CM | POA: Insufficient documentation

## 2022-03-25 DIAGNOSIS — H6122 Impacted cerumen, left ear: Secondary | ICD-10-CM | POA: Insufficient documentation

## 2022-03-25 NOTE — ED Provider Notes (Signed)
[facility]           Name: Chelsea Mccall  Age and Gender: 46 y.o. female  PCP: No Pcp    Chief Complaint:  Patient presents with     Chief Complaint   Patient presents with    Hearing Loss       HPI    HPI     Chelsea Mccall, date of birth 1975-11-15, is a 46 y.o. female who presents to the Emergency Department with a chief complaint of can't hear out of left ear for a few days.  She denies any pain.  No fevers.  She has been using Debrox drops with no improvement.  Denies any Q-tip use.    Other than noted above, review of systems obtained and negative.      History reviewed This Encounter: Medical History  Surgical History  Family History  Social History    Past Medical History:  Diagnosis     Past Medical History:   Diagnosis Date    Female infertility     Hepatitis C     Kidney stone     Migraine        Past Surgical History:  Past Surgical History:   Procedure Laterality Date    Hx appendectomy      Hx cholecystectomy      Hx wisdom teeth extraction         Family History:   Family History   Problem Relation Age of Onset    Breast Cancer Maternal Grandmother     Breast Cancer Paternal Grandmother     Bleeding Disorders Neg Hx     Congenital Defect Neg Hx        Social History     Social History     Tobacco Use    Smoking status: Every Day     Packs/day: 1.00     Types: Cigarettes    Smokeless tobacco: Never   Substance Use Topics    Alcohol use: Yes     Comment: DAILY    Drug use: Not Currently     Types: Other     Comment: pain pills       Social History     Substance and Sexual Activity   Drug Use Not Currently    Types: Other    Comment: pain pills       No Pcp    Allergies   Allergen Reactions    Naproxen Rash     "Was given with an antibiotic when had wisdom teeth taken out, and doctors are unsure which I am allergic to"    Other      ? ABX WHEN MIXED WITH NAPROSYN       No outpatient medications have been marked as taking for the 03/25/22 encounter [facility] Encounter).       PE:   ED  Triage Vitals [03/25/22 1123]   BP (Non-Invasive) 121/80   Heart Rate 88   Respiratory Rate 18   Temperature 37 C (98.6 F)   SpO2 99 %   Weight 54.4 kg (120 lb)   Height 1.626 m (5\' 4" )     Physical Exam    Constitutional: Pt is well-developed and well-nourished Caucasian female.  Nontoxic appearing.  HEENT: Normocephalic and atraumatic. Conjunctivae are normal. EOM are intact.  Right TM normal.  Left TM unable to be visualized due to cerumen impaction.  No mastoid or tragus tenderness.  Neck: Soft, supple, full range of  motion.  No tenderness.  Musculoskeletal: Normal range of motion. No deformities.  Exhibits no edema and no tenderness.   Neurological: CNs 2-12 grossly intact.  No focal deficits noted. GCS 15  Skin: Warm and dry. No rash or lesions  Psychiatric: Patient has a normal mood and affect.     DDx:  Differential diagnosis includes, but is not limited to cerumen impaction, otitis media, otitis externa    Initial workup:      Orders:  No orders of the defined types were placed in this encounter.          Diagnostics:  I have personally reviewed all laboratory and imaging results for this patient.  Results are as listed below.  Labs:  No results found for this or any previous visit (from the past 12 hour(s)).  Labs reviewed and interpreted by me.    Radiology:    No orders to display       EKG:  No results found for this visit on 03/25/22 (from the past 720 hour(s)).    Medical Decision Making  This is a 46 year old female presents with unable to hear from her left ear.  Cerumen impaction noted on exam.    She does understand agree to follow-up with ENT for removal and return if any new or worsening symptoms.        ED Course:    During the patient's stay in the emergency department, the above listed imaging and/or labs were performed to assist with medical decision making and were reviewed by myself when available for review.   Relevant prior external notes and tests were reviewed by myself if  available.  Patient rechecked and remained stable throughout remainder of emergency department course.   All questions/concerns addressed, and patient agrees with disposition plan.           Medications given during ED stay include:       Discharge Medication List as of 03/25/2022 12:00 PM          Clinical Impression:   Clinical Impression   Impacted cerumen of left ear (Primary)       Disposition: Discharged        // Herschell Dimes PA-C 03/25/2022, 12:00   [place] [place] Emergency Department    Parts of this chart were completed in a retrospective fashion due to simultaneous patient care activities in the Emergency Department.

## 2022-03-25 NOTE — ED Triage Notes (Signed)
CAN'T HEAR OUT OF LEFT EAR, DENIES EAR PAIN

## 2022-03-30 ENCOUNTER — Other Ambulatory Visit: Payer: Self-pay

## 2022-03-30 ENCOUNTER — Ambulatory Visit (INDEPENDENT_AMBULATORY_CARE_PROVIDER_SITE_OTHER): Payer: Medicaid Other | Admitting: Otolaryngology

## 2022-03-30 ENCOUNTER — Encounter (INDEPENDENT_AMBULATORY_CARE_PROVIDER_SITE_OTHER): Payer: Self-pay | Admitting: Otolaryngology

## 2022-03-30 VITALS — Temp 97.9°F | Ht 64.0 in | Wt 120.6 lb

## 2022-03-30 DIAGNOSIS — H6121 Impacted cerumen, right ear: Secondary | ICD-10-CM

## 2022-03-30 DIAGNOSIS — H938X9 Other specified disorders of ear, unspecified ear: Secondary | ICD-10-CM

## 2022-03-30 NOTE — Progress Notes (Signed)
ENT, [place]  [address]  [place] [address]  [phone]    PATIENT NAME:  Chelsea Mccall  MRN:  [id number]  DOB:  09/03/1975  DATE OF SERVICE: 03/30/2022    Chief Complaint:  Richardean Sale in Ear (Left ear)      HPI:  Chelsea Mccall is a 46 y.o. female presenting as a new patient for evaluation of left ear impaction. She states that her left ear was cleared out at [facility]. Patient reports issues with her hearing at that time. Otherwise, no other complaints at this time.    Past Medical History:  Past Medical History:   Diagnosis Date    Female infertility     Hepatitis C     Kidney stone     Migraine          Past Surgical History:  Past Surgical History:   Procedure Laterality Date    HX APPENDECTOMY      HX CHOLECYSTECTOMY      HX WISDOM TEETH EXTRACTION           Family History:  Family Medical History:       Problem Relation (Age of Onset)    Breast Cancer Maternal Grandmother, Paternal Grandmother            Social History:  Social History     Tobacco Use   Smoking Status Every Day    Packs/day: 1    Types: Cigarettes   Smokeless Tobacco Never     Social History     Substance and Sexual Activity   Alcohol Use Yes    Alcohol/week: 6.0 - 10.0 standard drinks of alcohol    Types: 6 - 10 Cans of beer per week    Comment: DAILY     Social History     Occupational History    Not on file       Medications:  Outpatient Medications Marked as Taking for the 03/30/22 encounter (Office Visit) with Concha Norway, MD   Medication Sig    buprenorphine-naloxone (SUBOXONE) 8-2 mg Sublingual Tablet, Sublingual Place 8 Tablets under the tongue Once a day       Allergies:  Allergies   Allergen Reactions    Naproxen Rash     "Was given with an antibiotic when had wisdom teeth taken out, and doctors are unsure which I am allergic to"    Other      ? ABX WHEN MIXED WITH NAPROSYN       Review of Systems:  Do you have any fevers: no   Any weight change: no   Change in your vision: no    Chest  Pain: no   Shortness of Breath: no   Stomach pain: no   Urinary difficulity: no   Joint Pain: no   Skin Problems: no   Weakness or Numbness: yes   Easy Bruising or Bleeding: yes   Excessive Thirst: no   Seasonal Allergies: yes    All other systems reviewed and found to be negative.    Physical Exam:  Temperature 36.6 C (97.9 F), height 1.626 m (5\' 4" ), weight 54.7 kg (120 lb 9.6 oz), last menstrual period 03/12/2022, unknown if currently breastfeeding.  Body mass index is 20.7 kg/m.  General Appearance: Pleasant, cooperative, healthy, and in no acute distress.  Eyes: Conjunctivae/corneas clear, PERRLA, EOM's intact.  Head and Face: Normocephalic, atraumatic.  Face symmetric, no obvious lesions.   Right Ear:  Pinnae: Normal shape and position.        External auditory canals: Cerumen impaction, cleared with suction and curette.  Patent without inflammation.       Tympanic membranes:  Intact, translucent, midposition, middle ear aerated.  Left Ear:       Pinnae: Normal shape and position.        External auditory canals:  Patent without inflammation.       Tympanic membranes:  Intact, translucent, midposition, middle ear aerated.  Nose: External pyramid midline. Mucosa normal. No purulence, polyps, or crusts.   Oral Cavity/Oropharynx: No mucosal lesions, masses, or pharyngeal asymmetry.  Tonsils: Deferred.  Nasopharynx: Deferred.  Hypopharynx/Larynx: Deferred.  Neck: no cervical adenopathy, no palpable thyroid or salivary gland masses  Thyroid: no significant thyroid abnormality by palpation  Cardiovascular: Good perfusion of upper extremities.  No cyanosis of the hands or fingers.  Lungs: No apparent stridorous breathing. No acute distress.  Skin: Skin warm and dry.  Neurologic: Cranial nerves:  grossly intact.  Psychiatric: Alert and oriented x 3.    Procedure:  ENT, [facility]  [address]  [place] [address]    Procedure Note    Name: Saliah Crisp MRN:  [id number]    Date: 03/30/2022 Age: 46 y.o.  DOB:   1975-07-14       Ear Cerumen Removal    Performed by: Marcene Duos, MD  Authorized by: Marcene Duos, MD    Consent:     Consent obtained:  Verbal    Consent given by:  Patient    Risks, benefits, and alternatives were discussed: yes    Universal protocol:     Procedure explained and questions answered to patient or proxy's satisfaction: yes      Site/side marked: yes      Immediately prior to procedure, a time out was called: yes      Patient identity confirmed:  Verbally with patient  Procedure details:     Location:  R ear    Procedure type: curette      Procedure outcomes: cerumen removed    Post-procedure details:     Procedure completion:  Tolerated  Comments:      Cerumen Removal:    Right EAC(s) examined under binocular otoscopy. Tympanic membranes occluded. Impacted cerumen was cleaned from the canal(s) using curette and #7 suction.  Patient tolerated procedure well.  Underlying anatomy was normal.     I am scribing for, and in the presence of, Dr. Cinda Quest for services provided on 03/30/2022.  Malachi Carl, SCRIBE   [place], SCRIBE  03/30/2022, 08:32        Malachi Carl, SCRIBE,  03/30/2022, 08:32.    A time-out was performed before the procedure to validate patient identification and insure right patient, right procedure, right equipment.        Malachi Carl, SCRIBE    Data Reviewed:  N/A    Assessment:  Cerumen impactions, right, clear  Aural fullness, right, improved    Plan:  Orders Placed This Encounter    Ear Cerumen Removal     Cerumen impactions removed today.  F/U PRN for reevaluation.    Marcene Duos, MD  Department of Otolaryngology  [facility]    PCP: No Pcp  No address on file   REF: Self, Referral  No address on file     I am scribing for, and in the presence of, Dr. Cinda Quest for services provided on 03/30/2022.  Christene Slates  Lind Guest, SCRIBE  [place], SCRIBE,  03/30/2022, 08:33.      I personally  performed the services described in this documentation, as scribed  in my presence, and it is both accurate  and complete.    Marcene Duos, MD

## 2022-03-30 NOTE — Procedures (Signed)
ENT, [facility]  [place] [address]    Procedure Note    Name: Chelsea Mccall MRN:  [id number]   Date: 03/30/2022 Age: 46 y.o.  DOB:   05-18-1976       Ear Cerumen Removal    Performed by: Concha Norway, MD  Authorized by: Concha Norway, MD    Consent:     Consent obtained:  Verbal    Consent given by:  Patient    Risks, benefits, and alternatives were discussed: yes    Universal protocol:     Procedure explained and questions answered to patient or proxy's satisfaction: yes      Site/side marked: yes      Immediately prior to procedure, a time out was called: yes      Patient identity confirmed:  Verbally with patient  Procedure details:     Location:  R ear    Procedure type: curette      Procedure outcomes: cerumen removed    Post-procedure details:     Procedure completion:  Tolerated  Comments:      Cerumen Removal:    Right EAC(s) examined under binocular otoscopy. Tympanic membranes occluded. Impacted cerumen was cleaned from the canal(s) using curette and #7 suction.  Patient tolerated procedure well.  Underlying anatomy was normal.     I am scribing for, and in the presence of, Dr. Whitney Post for services provided on 03/30/2022.  Celedonio Miyamoto, SCRIBE   [place], [place]  03/30/2022, 08:32        Celedonio Miyamoto, SCRIBE,  03/30/2022, 08:32.    A time-out was performed before the procedure to validate patient identification and insure right patient, right procedure, right equipment.        I personally performed the services described in this documentation, as scribed  in my presence, and it is both accurate  and complete.    Concha Norway, MD

## 2022-10-01 ENCOUNTER — Other Ambulatory Visit (HOSPITAL_COMMUNITY): Payer: Self-pay | Admitting: NURSE PRACTITIONER

## 2022-10-01 DIAGNOSIS — Z1231 Encounter for screening mammogram for malignant neoplasm of breast: Secondary | ICD-10-CM

## 2022-10-16 ENCOUNTER — Other Ambulatory Visit: Payer: Self-pay

## 2022-10-16 ENCOUNTER — Inpatient Hospital Stay
Admission: RE | Admit: 2022-10-16 | Discharge: 2022-10-16 | Disposition: A | Discharge status: Home / Self Care | Payer: Medicaid Other | Source: Ambulatory Visit | Attending: NURSE PRACTITIONER | Admitting: NURSE PRACTITIONER

## 2022-10-16 ENCOUNTER — Encounter (HOSPITAL_BASED_OUTPATIENT_CLINIC_OR_DEPARTMENT_OTHER): Payer: Self-pay

## 2022-10-16 DIAGNOSIS — Z1231 Encounter for screening mammogram for malignant neoplasm of breast: Secondary | ICD-10-CM | POA: Insufficient documentation

## 2022-10-25 DIAGNOSIS — Z1231 Encounter for screening mammogram for malignant neoplasm of breast: Secondary | ICD-10-CM

## 2022-11-03 ENCOUNTER — Other Ambulatory Visit: Payer: Self-pay

## 2022-11-03 ENCOUNTER — Encounter (HOSPITAL_BASED_OUTPATIENT_CLINIC_OR_DEPARTMENT_OTHER): Payer: Self-pay | Admitting: Obstetrics & Gynecology

## 2022-11-03 ENCOUNTER — Ambulatory Visit: Payer: Medicaid Other | Attending: Obstetrics & Gynecology | Admitting: Obstetrics & Gynecology

## 2022-11-03 VITALS — BP 143/85 | HR 91 | Temp 97.5°F | Resp 16 | Ht 64.0 in | Wt 128.3 lb

## 2022-11-03 DIAGNOSIS — B1920 Unspecified viral hepatitis C without hepatic coma: Secondary | ICD-10-CM

## 2022-11-03 DIAGNOSIS — Z8619 Personal history of other infectious and parasitic diseases: Secondary | ICD-10-CM | POA: Insufficient documentation

## 2022-11-03 DIAGNOSIS — Z124 Encounter for screening for malignant neoplasm of cervix: Secondary | ICD-10-CM | POA: Insufficient documentation

## 2022-11-03 DIAGNOSIS — N9089 Other specified noninflammatory disorders of vulva and perineum: Secondary | ICD-10-CM | POA: Insufficient documentation

## 2022-11-03 DIAGNOSIS — F1020 Alcohol dependence, uncomplicated: Secondary | ICD-10-CM

## 2022-11-03 DIAGNOSIS — N939 Abnormal uterine and vaginal bleeding, unspecified: Secondary | ICD-10-CM | POA: Insufficient documentation

## 2022-11-03 DIAGNOSIS — F17210 Nicotine dependence, cigarettes, uncomplicated: Secondary | ICD-10-CM | POA: Insufficient documentation

## 2022-11-03 DIAGNOSIS — R140 Abdominal distension (gaseous): Secondary | ICD-10-CM | POA: Insufficient documentation

## 2022-11-03 DIAGNOSIS — F1090 Alcohol use, unspecified, uncomplicated: Secondary | ICD-10-CM

## 2022-11-03 DIAGNOSIS — Z1151 Encounter for screening for human papillomavirus (HPV): Secondary | ICD-10-CM | POA: Insufficient documentation

## 2022-11-03 DIAGNOSIS — F1010 Alcohol abuse, uncomplicated: Secondary | ICD-10-CM | POA: Insufficient documentation

## 2022-11-03 LAB — COMPREHENSIVE METABOLIC PANEL, NON-FASTING
ALBUMIN: 4.3 g/dL (ref 3.5–5.0)
ALKALINE PHOSPHATASE: 68 U/L (ref 40–110)
ALT (SGPT): 15 U/L (ref 8–22)
ANION GAP: 13 mmol/L (ref 4–13)
AST (SGOT): 22 U/L (ref 8–45)
BILIRUBIN TOTAL: 0.6 mg/dL (ref 0.3–1.3)
BUN/CREA RATIO: 16 (ref 6–22)
BUN: 10 mg/dL (ref 8–25)
CALCIUM: 9.5 mg/dL (ref 8.6–10.2)
CHLORIDE: 103 mmol/L (ref 96–111)
CO2 TOTAL: 20 mmol/L — ABNORMAL LOW (ref 22–30)
CREATININE: 0.64 mg/dL (ref 0.60–1.05)
ESTIMATED GFR - FEMALE: >90 mL/min/BSA (ref >=60)
GLUCOSE: 99 mg/dL (ref 65–125)
POTASSIUM: 3.9 mmol/L (ref 3.5–5.1)
PROTEIN TOTAL: 7.4 g/dL (ref 6.4–8.3)
SODIUM: 136 mmol/L (ref 136–145)

## 2022-11-03 LAB — CBC
HCT: 37.6 % (ref 34.8–46.0)
HGB: 12.9 g/dL (ref 11.5–16.0)
MCH: 33.9 pg — ABNORMAL HIGH (ref 26.0–32.0)
MCHC: 34.3 g/dL (ref 31.0–35.5)
MCV: 98.9 fL (ref 78.0–100.0)
MPV: 10.4 fL (ref 8.7–12.5)
PLATELETS: 223 10*3/uL (ref 150–400)
RBC: 3.8 10*6/uL — ABNORMAL LOW (ref 3.85–5.22)
RDW-CV: 11.9 % (ref 11.5–15.5)
WBC: 7.5 10*3/uL (ref 3.7–11.0)

## 2022-11-03 LAB — THYROID STIMULATING HORMONE WITH FREE T4 REFLEX: TSH: 1.985 u[IU]/mL (ref 0.350–4.940)

## 2022-11-03 NOTE — Cancer Center Note (Signed)
[facility]  Department of Gynecology Oncology  New Patient History and Physical      NOTE AUTHORED BY: Theodore Demark, APRN, FNP-BC    Date: 11/03/2022  Name: Chelsea Mccall  MRN: [id number]  Referring Physician: Self, Referral  Primary Care Provider: No Pcp    Reason for visit/consultation Abnormal Pap and Vaginal Bleeding    History of Present Illness:  Information Obtained from: patient    This very nice 47 yo [id number] presents as a self-referral for a history of abnormal HPV+ pap around 2019 as well as vulvar HPV in 2004, plus recent abnormal vaginal bleeding. Reports 3 week long crampy, clotty period that started around 09/26/2022, then stopped bleeding one week, then restarted her period 4/23. She is SA but is not contracepted.     [name] tells me she has not had abnormal periods before. She reports an abnormal pap around 2019 but repeat at the [facility] was normal. She has abd bloating and pain with a recent 8# weight gain.     Reviewing her chart with her, she admits to a history of hep C and believes she was treated but cannot recall the specifics of that. Has not seen a PCP or GI in recent years. She tells me she is an alcoholic and drinks at least 6 beers per day and has done so since she had an 18 week IUFD to a baby girl. She smokes 1 ppd. When asked about the Suboxone on her chart, she tells me she does not take it and has seen Dr. Reino Kent in the past but she does say she has given it to her ex husband. She is not sure who refilled it last.     OB History       Gravida   6    Para   0    Term   0    Preterm   0    AB   6    Living   0         SAB   2    IAB   3    Ectopic        Multiple        Live Births   0               GYN:   Menarche:  79  Menopause:  n/a  G:  6  P:  0  Mammogram:  no  H/O Breast Biopsy:  no  Hx abnormal pap: yes, around 2019  Hx HRT: no  Hx STDs: HPV  Sexually active: yes, no issues    Past Medical History  Past Medical History:   Diagnosis Date    Abnormal Pap smear of  cervix 2019    Female infertility     Hepatitis C     reports blood transfusion in her mother who also had hep C    Kidney stone     Migraine        Past Surgical History:   Procedure Laterality Date    HX APPENDECTOMY      HX CHOLECYSTECTOMY      HX WISDOM TEETH EXTRACTION         Allergies   Allergen Reactions    Naproxen Rash     "Was given with an antibiotic when had wisdom teeth taken out, and doctors are unsure which I am allergic to"    Other      ? ABX WHEN  MIXED WITH NAPROSYN     Current Outpatient Medications   Medication Sig    buprenorphine HCL (SUBUTEX) 8 mg Sublingual Tablet, Sublingual  (Patient not taking: Reported on 03/30/2022)    buprenorphine-naloxone (SUBOXONE) 8-2 mg Sublingual Tablet, Sublingual Place 8 Tablets under the tongue Once a day       Family History  Family Medical History:       Problem Relation (Age of Onset)    Breast Cancer Maternal Grandmother, Paternal Grandmother    Other cancer Father    Prostate Cancer Paternal Grandfather    Thyroid Cancer Paternal Grandmother          Social History  Occupation:   Social History     Occupational History    Not on file    [place] [address]   reports that she has been smoking cigarettes. She has never used smokeless tobacco. She reports current alcohol use of about 6 - 10 cans of beer per week. She reports that she does not currently use drugs after having used the following drugs: Other. She reports being sexually active and has had partner(s) who are female. She reports using the following method of birth control/protection: None.    PMH, PSHx, Family History and Social history reviewed with the patient and updated in the system.     ROS: Other than ROS in the HPI, all other systems were negative.    Physical Examination:  Most Recent Vitals    Flowsheet Row Office Visit from 11/03/2022 in Gynecologic Oncology, [facility]   Temperature 36.4 C (97.5 F) filed at... 11/03/2022 1010   Heart Rate 91 filed at...  11/03/2022 1010   Respiratory Rate 16 filed at... 11/03/2022 1010   BP (Non-Invasive) 143/85 filed at... 11/03/2022 1010   SpO2 99 % filed at... 11/03/2022 1010   Height 1.626 m (5\' 4" ) filed at... 11/03/2022 1010   Weight 58.2 kg (128 lb 4.9 oz) filed at... 11/03/2022 1010   BMI (Calculated) 22.07 filed at... 11/03/2022 1010   BSA (Calculated) 1.62 filed at... 11/03/2022 1010      ECOG Status: 0 - Fully active, able to carry on all pre-disease performance without restriction.     General: appears stated age, no distress, and anxious  Eyes: Conjunctiva clear., Pupils equal and round. , Sclera non-icteric.   HENT:Mouth mucous membranes moist.   Neck: No JVD or thyromegaly or lymphadenopathy  Lungs: clear to auscultation bilaterally.  and thoracic excursion   normal  Cardiovascular: RRR without murmur.  Abdomen: soft, mild distention RUQ and RLQ, mildly diffusely tender  Pelvic exam: exam chaperoned by nurse, EGBUS within flat dark lesions B sides, one condylomatous lesion R mid labia majora, normal cervix without lesions, polyps or tenderness, uterus normal size, shape, consistency, no mass or tenderness, adnexa normal in size without mass or tenderness.  Extremities: extremities normal, atraumatic, no cyanosis or edema  Skin: Skin color, texture, turgor normal. No rashes or lesions  Neurologic: grossly normal, gait is normal, CN II - XII grossly intact , and alert and oriented x3  Lymphatics: No nodes appreciated  Psychiatric: AOx3, anxious, behavior normal, memory normal, thought content normal, and judgement normal  Access:  No current access site    Labs  Results for orders placed or performed in visit on 11/03/22 (from the past 36 hour(s))   COMPREHENSIVE METABOLIC PANEL, NON-FASTING   Result Value Ref Range    SODIUM 136 136 - 145 mmol/L  POTASSIUM 3.9 3.5 - 5.1 mmol/L    CHLORIDE 103 96 - 111 mmol/L    CO2 TOTAL 20 (L) 22 - 30 mmol/L    ANION GAP 13 4 - 13 mmol/L    BUN 10 8 - 25 mg/dL    CREATININE 9.60  4.54 - 1.05 mg/dL    BUN/CREA RATIO 16 6 - 22    ALBUMIN 4.3 3.5 - 5.0 g/dL     CALCIUM 9.5 8.6 - 09.8 mg/dL    GLUCOSE 99 65 - 119 mg/dL    ALKALINE PHOSPHATASE 68 40 - 110 U/L    ALT (SGPT) 15 8 - 22 U/L    AST (SGOT)  22 8 - 45 U/L    BILIRUBIN TOTAL 0.6 0.3 - 1.3 mg/dL    PROTEIN TOTAL 7.4 6.4 - 8.3 g/dL    ESTIMATED GFR - FEMALE >90 >=60 mL/min/BSA     Pathology:  none    Radiology  None to review    Assessment/Plan:   1. Abnormal uterine bleeding  - exam normal except vulvar lesions  - check [place] and labs  - THYROID STIMULATING HORMONE WITH FREE T4 REFLEX  - CBC  - [place] FEMALE PELVIS; Future    2. Alcohol use disorder  - 6 beers per day and history hep C  - referred to GI  - COMPREHENSIVE METABOLIC PANEL, NON-FASTING  - THYROID STIMULATING HORMONE WITH FREE T4 REFLEX  - CBC  - Refer to [facility] Gastroenterology/Hepatology,Physician Office Center; Future    3. History of infection due to human papilloma virus (HPV)  - pap with cotest today  - history cryotherapy around 2004 for genital warts    4. Vulval lesion  - RTC in 1-2 weeks for vulvar biopsy    5. Screening for cervical cancer  - CYTOPATHOLOGY, GYN +/- HIGH RISK HPV    6. Abdominal bloating  - Check pelvic US for gyn source and refer to GI    7. Hepatitis C  - Refer to [facility] Gastroenterology/Hepatology,Physician Office Center; Future    Return to clinic in 1-2 weeks to review [place] and plan vulvar biopsy. EMB may be needed. She wants hyst but understands there may be additional options that we'll discuss.     The patient was seen independently.    Riesa Pope [place], APRN,FNP-BC  11/03/2022, 12:22    On the day of the encounter, a total of 30 minutes was spent on this patient encounter including review of historical information, examination, documentation and post-visit activities. The time documented excludes procedural time.    CC:  No Pcp  No address on file    No primary care provider on file.

## 2022-11-05 LAB — CYTOPATHOLOGY, GYN +/- HIGH RISK HPV: Clinical History: ABNORMAL

## 2022-11-06 LAB — HUMAN PAPILLOMA VIRUS (HPV) BY PCR WITH HIGH RISK GENOTYPING (THINPREP)
HPV OTHER: NEGATIVE
HPV16 PCR: NEGATIVE
HPV18 PCR: NEGATIVE

## 2022-11-09 ENCOUNTER — Encounter (HOSPITAL_BASED_OUTPATIENT_CLINIC_OR_DEPARTMENT_OTHER): Payer: Self-pay | Admitting: Obstetrics & Gynecology

## 2022-11-09 ENCOUNTER — Other Ambulatory Visit (HOSPITAL_BASED_OUTPATIENT_CLINIC_OR_DEPARTMENT_OTHER): Payer: Self-pay | Admitting: Obstetrics & Gynecology

## 2022-11-09 MED ORDER — CLINDAMYCIN 2 % VAGINAL CREAM
1.0000 | TOPICAL_CREAM | Freq: Every evening | VAGINAL | 0 refills | Status: AC
Start: 2022-11-09 — End: 2022-11-12

## 2022-11-10 ENCOUNTER — Inpatient Hospital Stay (HOSPITAL_BASED_OUTPATIENT_CLINIC_OR_DEPARTMENT_OTHER)
Admission: RE | Admit: 2022-11-10 | Discharge: 2022-11-10 | Disposition: A | Discharge status: Home / Self Care | Payer: Medicaid Other | Source: Ambulatory Visit | Attending: Obstetrics & Gynecology | Admitting: Obstetrics & Gynecology

## 2022-11-10 ENCOUNTER — Other Ambulatory Visit: Payer: Self-pay

## 2022-11-10 ENCOUNTER — Ambulatory Visit: Payer: Medicaid Other | Attending: Obstetrics & Gynecology | Admitting: Obstetrics & Gynecology

## 2022-11-10 ENCOUNTER — Encounter (HOSPITAL_BASED_OUTPATIENT_CLINIC_OR_DEPARTMENT_OTHER): Payer: Self-pay | Admitting: Obstetrics & Gynecology

## 2022-11-10 VITALS — BP 148/87 | HR 88 | Temp 98.1°F | Resp 16 | Ht 63.0 in | Wt 127.6 lb

## 2022-11-10 DIAGNOSIS — N9089 Other specified noninflammatory disorders of vulva and perineum: Secondary | ICD-10-CM | POA: Insufficient documentation

## 2022-11-10 DIAGNOSIS — N939 Abnormal uterine and vaginal bleeding, unspecified: Secondary | ICD-10-CM

## 2022-11-10 DIAGNOSIS — F1020 Alcohol dependence, uncomplicated: Secondary | ICD-10-CM | POA: Insufficient documentation

## 2022-11-10 DIAGNOSIS — N903 Dysplasia of vulva, unspecified: Secondary | ICD-10-CM | POA: Insufficient documentation

## 2022-11-10 DIAGNOSIS — N920 Excessive and frequent menstruation with regular cycle: Secondary | ICD-10-CM | POA: Insufficient documentation

## 2022-11-10 HISTORY — DX: Alcohol dependence, uncomplicated (CMS HCC): F10.20

## 2022-11-10 NOTE — Procedures (Signed)
GYNECOLOGIC ONCOLOGY, [facility]  [address]  [place] [address]  Operated by [facility]  Procedure Note    Name: Chelsea Mccall MRN:  [id number]   Date: 11/10/2022 DOB:  June 12, 1976 (47 y.o.)         [address]8119 - COLPOSCOPY OF THE VULVA W/ BIOPSY (AMB ONLY)    Performed by: Alen Bleacher, APRN,FNP-BC  Authorized by: Alen Bleacher, APRN,FNP-BC    Time Out:     Immediately before the procedure, a time out was called:  Yes    Patient verified:  Yes    Procedure Verified:  Yes    Site Verified:  Yes  Documentation:      [facility]  Department of Gynecologic Oncology  Vulvoscopy Procedure Note    Referring MD: Self, Referral  No address on file  11/10/2022    47 y.o. [id number]  Marital Status: Single    Abnormal Pap History:  09/03/2015 NILM, HPV neg  11/03/2022 NILM HPV neg    Smoking: 1 pack per day  Alcohol abuse: current  Drug abuse: past, denies current drug use    Past Medical History:  2019: Abnormal Pap smear of cervix  No date: Hepatitis C      Comment:  reports blood transfusion in her mother who also had hep C while pregnant with her and her sister  No date: Kidney stone  No date: Migraine    Past Surgical History:  No date: HX APPENDECTOMY  No date: HX CHOLECYSTECTOMY  No date: HX WISDOM TEETH EXTRACTION    Outpatient Medications Prior to Visit:  buprenorphine-naloxone (SUBOXONE) 8-2 mg Sublingual Tablet, Sublingual, Place 8 Tablets under the tongue Once a day  clindamycin phosphate (CLEOCIN) 2 % Vaginal Cream, Insert 1 Applicator into the vagina Every evening for 3 days  buprenorphine HCL (SUBUTEX) 8 mg Sublingual Tablet, Sublingual,     No facility-administered medications prior to visit.     -- Naproxen -- Rash    --  "Was given with an antibiotic when had wisdom             teeth taken out, and doctors are unsure which I am             allergic to"   -- Other     --  ? ABX WHEN MIXED WITH NAPROSYN    HPV counseling: Yes    Physical Examination:  Most Recent  Vitals    Flowsheet Row Office Visit from 11/10/2022 in Gynecologic Oncology, [facility]   Temperature 36.7 C (98.1 F) filed at... 11/10/2022 0815   Heart Rate 88 filed at... 11/10/2022 0815   Respiratory Rate 16 filed at... 11/10/2022 0815   BP (Non-Invasive) 148/87 filed at... 11/10/2022 0815   SpO2 99 % filed at... 11/10/2022 0815   Height 1.6 m (5\' 3" ) filed at... 11/10/2022 0815   Weight 57.9 kg (127 lb 10.3 oz) filed at... 11/10/2022 0815   BMI (Calculated) 22.66 filed at... 11/10/2022 0815   BSA (Calculated) 1.6 filed at... 11/10/2022 0815      Body mass index is 22.61 kg/m.    Indication : (N90.89) Vulvar lesion  (primary encounter diagnosis)  Plan: 14782 - COLPOSCOPY OF THE VULVA W/ BIOPSY (AMB         ONLY), SURGICAL PATHOLOGY SPECIMEN    The patient was brought to the procedure room. I was assisted by Philipp Ovens, LPN. Appropriate consent  was reviewed and obtained. Time out was performed.     A 3% vinegar solution was applied to the vulva, perineum, and perianal area. Acetowhite lesion was noted at the very anterior aspect of the inner labia minora on both sides with full visualization of the lesion. The area was slightly raised and dense white. There were four additional flatter darkened lesions on the right perineum and labia majora--see Media. There was an additional mildly acetowhite and darkened lesion on the right labia majora.     A biopsy was taken at the right inner labia minora, anterior aspect, as well as one of the right side perineal/outer labia majora lesions. Again please see Media for medical photography done before and after the two biopsies. Hemostasis was secured with direct pressure and silver nitrate.     Impression:    1. 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   2. Alcholism: strongly encouraged patient to seek AA meetings and consider short and long-term options for rehabilitation. She will consider but tells me  she cannot take off work at this time for treatment. '  3. Menorrhagia: I will call her with final [place] results.     Plan: RTC pending path. Further disposition will be based on the pathology results. She understands there are several areas that may need to be removed in their entirety.     The patient was instructed that she should seek medical attention immediately if any significant bleeding, pain, or fever occur.      The patient was seen independently.    Theodore Demark, APRN, FNP-BC            [place], [place]

## 2022-11-17 ENCOUNTER — Encounter (HOSPITAL_BASED_OUTPATIENT_CLINIC_OR_DEPARTMENT_OTHER): Payer: Self-pay | Admitting: Obstetrics & Gynecology

## 2022-11-23 LAB — SURGICAL PATHOLOGY SPECIMEN: Clinical History: NEGATIVE

## 2022-11-27 ENCOUNTER — Telehealth (HOSPITAL_BASED_OUTPATIENT_CLINIC_OR_DEPARTMENT_OTHER): Payer: Self-pay | Admitting: Obstetrics & Gynecology

## 2022-11-27 NOTE — Telephone Encounter (Signed)
PC to pt and reviewed benign vulvar biopsy path, benign. She started bleeding again 5/17. Risks, benefits, side effects, and alternatives reviewed at length of addressing her menorrhagia. She is agreeable to an EMB plus Mirena IUD insertion on 6/5 at 0845. Pt agreeable.

## 2022-12-09 ENCOUNTER — Ambulatory Visit (HOSPITAL_BASED_OUTPATIENT_CLINIC_OR_DEPARTMENT_OTHER): Payer: Self-pay | Admitting: Obstetrics & Gynecology

## 2023-01-26 ENCOUNTER — Other Ambulatory Visit: Payer: Self-pay

## 2023-01-26 ENCOUNTER — Other Ambulatory Visit (INDEPENDENT_AMBULATORY_CARE_PROVIDER_SITE_OTHER): Payer: 59 | Admitting: Rheumatology

## 2023-01-26 ENCOUNTER — Ambulatory Visit
Payer: 59 | Attending: Student in an Organized Health Care Education/Training Program | Admitting: Student in an Organized Health Care Education/Training Program

## 2023-01-26 ENCOUNTER — Encounter (INDEPENDENT_AMBULATORY_CARE_PROVIDER_SITE_OTHER): Payer: Self-pay | Admitting: Student in an Organized Health Care Education/Training Program

## 2023-01-26 DIAGNOSIS — R140 Abdominal distension (gaseous): Secondary | ICD-10-CM

## 2023-01-26 DIAGNOSIS — F1090 Alcohol use, unspecified, uncomplicated: Secondary | ICD-10-CM

## 2023-01-26 DIAGNOSIS — R768 Other specified abnormal immunological findings in serum: Secondary | ICD-10-CM

## 2023-01-26 DIAGNOSIS — B1920 Unspecified viral hepatitis C without hepatic coma: Secondary | ICD-10-CM

## 2023-01-26 DIAGNOSIS — Z8619 Personal history of other infectious and parasitic diseases: Secondary | ICD-10-CM | POA: Insufficient documentation

## 2023-01-26 DIAGNOSIS — F1020 Alcohol dependence, uncomplicated: Secondary | ICD-10-CM

## 2023-01-26 LAB — HEPATITIS C ANTIBODY SCREEN WITH REFLEX TO HCV PCR: HCV ANTIBODY QUALITATIVE: REACTIVE — AB

## 2023-01-26 LAB — HEPATITIS A (HAV) IGG ANTIBODY: HAV IGG ANTIBODY: REACTIVE — AB

## 2023-01-26 LAB — HEPATITIS B SURFACE ANTIBODY: HBV SURFACE ANTIBODY QUANTITATIVE: 0 m[IU]/mL (ref <8)

## 2023-01-26 NOTE — Progress Notes (Signed)
Requesting Physician: Alen Bleacher, APRN,FN*  History of Present Illness  Chelsea Mccall is a 47 y.o. female who presents with a chief complaint of Hepatitis C   to clinic.  Pleasant 47 year old lady with a history of hepatitis-C referred to [place] for the above chief complaint.  Patient reports that a few months ago she sustained gynecological bleed that has since resolved however her blood tests showed positive HCV antibody and patient was referred to [place] for further evaluation.  Patient reports that she has a history of drug use in the past, denies any IV drug use, she is maintained on Suboxone.  Noted that she was diagnosed with hepatitis-C a few years ago and underwent treatment with Mavyret.  Last HCV RNA PCR was undetectable in 2019.  Patient reports that she suffered a miscarriage in 2019, since then has been drinking alcohol, admits to drinking 6 packs at night sometimes more, with occasional hard liquor use as well.  She reports that she went to rehab in 2022 however she relapsed right afterwards.  She complains of bloating however denies any unusual abdominal pain, nausea or vomiting, melena or hematochezia, confusion, jaundice, increased abdominal girth or lower limb edema.      Past History  Current Outpatient Medications   Medication Sig    buprenorphine-naloxone (SUBOXONE) 8-2 mg Sublingual Tablet, Sublingual Place 8 Tablets under the tongue Once a day     Allergies   Allergen Reactions    Naproxen Rash     "Was given with an antibiotic when had wisdom teeth taken out, and doctors are unsure which I am allergic to"    Other      ? ABX WHEN MIXED WITH NAPROSYN     Past Medical History:   Diagnosis Date    Abnormal Pap smear of cervix 2019    HSIL    Alcoholism (CMS HCC) 11/10/2022    current issues with alcoholism    Hepatitis C     reports blood transfusion in her mother who also had hep C    Kidney stone     Migraine          Past Surgical History:   Procedure Laterality Date    HX APPENDECTOMY       HX CHOLECYSTECTOMY      HX WISDOM TEETH EXTRACTION           Family History  Family Status   Relation Name Status    Mother  Alive    Father  Alive        head and neck cancer, [facility] in [facility]    MGM  Deceased    PGM  Alive    PGF  (Not Specified)    Neg Hx  (Not Specified)    Family Medical History:       Problem Relation (Age of Onset)    Breast Cancer Maternal Grandmother, Paternal Grandmother    Other cancer Father    Prostate Cancer Paternal Grandfather    Thyroid Cancer Paternal Grandmother                 Social History  Social History     Socioeconomic History    Marital status: Legally Separated    Number of children: 0    Years of education: 16   Tobacco Use    Smoking status: Every Day     Current packs/day: 1.00     Types: Cigarettes    Smokeless tobacco: Never   Vaping  Use    Vaping status: Never Used   Substance and Sexual Activity    Alcohol use: Yes     Alcohol/week: 6.0 - 10.0 standard drinks of alcohol     Types: 6 - 10 Cans of beer per week     Comment: DAILY    Drug use: Not Currently     Types: Other     Comment: pain pills    Sexual activity: Yes     Partners: Male     Birth control/protection: None     Review of Systems   Other than ROS in the HPI, all other systems were negative.    Examination  Vitals: BP (!) 131/103   Pulse 88   Temp 36.9 C (98.4 F)   Ht 1.626 m (5\' 4" )   Wt 58.4 kg (128 lb 12 oz)   SpO2 98%   BMI 22.10 kg/m        General: appears in good health and appears stated age  Eyes: Pupils equal and round, anicteric sclera.   HENT:normocephalic  Abdomen: soft, non-tender, non-distended   Neurologic: grossly normal  Lymphatics: no lower extremity edema  Psychiatric: Alert and Oriented x3, affect normal    Impression  Assessment/Plan   1. Alcohol use disorder    2. Hepatitis C          Recommendations:  Orders Placed This Encounter    [place] ELASTOGRAPHY LIVER WITH RUQ    HEPATITIS C ANTIBODY SCREEN WITH REFLEX TO HCV PCR    Hepatitis A Total Antibody    Hepatitis B Surface  Antibody     Pleasant 47 year old lady with history of substance use referred to [place] for history of HCV Abs.     # Hx of HCV  Remote history of HCV status post treatment with Mavyret.  Last HCV RNA PCR undetectable in 2019.  Will recheck HCV PCR  Discussed that treatment for hepatitis-C does not give immunity and patients who engage in high-risk behavior can get reinfected.    # Alcohol use disorder  Discussed the importance of abstinence particularly discussed alcohol-related liver disease  Offered referral to behavioral health however patient would like to think about it  I personally reviewed her last blood test done on 11/03/22, noted normal liver enzymes  Platelets 223, AST 22, ALT 15.    Alkaline phosphatase 68 and bilirubin 0.6  Will plan for a liver ultrasound with elastography for further evaluation.     F/U in 72m       Bradd Canary, MD  Associate Professor of Medicine  Gastroenterology and Hepatology  [place]

## 2023-01-27 ENCOUNTER — Other Ambulatory Visit (INDEPENDENT_AMBULATORY_CARE_PROVIDER_SITE_OTHER): Payer: Self-pay | Admitting: Student in an Organized Health Care Education/Training Program

## 2023-01-27 DIAGNOSIS — Z8619 Personal history of other infectious and parasitic diseases: Secondary | ICD-10-CM

## 2023-01-27 LAB — HEPATITIS B CORE ANTIBODY: HBV CORE TOTAL ANTIBODIES: NEGATIVE

## 2023-01-27 LAB — HEPATITIS C VIRUS (HCV) RNA DETECTION AND QUANTIFICATION, PCR, PLASMA: HCV QUANTITATIVE PCR: NOT DETECTED

## 2023-01-27 LAB — HEPATITIS B SURFACE ANTIGEN: HBV SURFACE ANTIGEN QUALITATIVE: NEGATIVE

## 2023-01-29 ENCOUNTER — Ambulatory Visit (INDEPENDENT_AMBULATORY_CARE_PROVIDER_SITE_OTHER): Payer: Self-pay | Admitting: Student in an Organized Health Care Education/Training Program

## 2023-02-10 ENCOUNTER — Ambulatory Visit (HOSPITAL_BASED_OUTPATIENT_CLINIC_OR_DEPARTMENT_OTHER): Payer: Self-pay

## 2023-02-18 ENCOUNTER — Encounter (INDEPENDENT_AMBULATORY_CARE_PROVIDER_SITE_OTHER): Payer: Self-pay

## 2023-03-03 ENCOUNTER — Encounter (INDEPENDENT_AMBULATORY_CARE_PROVIDER_SITE_OTHER): Payer: Self-pay | Admitting: Student in an Organized Health Care Education/Training Program

## 2023-04-12 ENCOUNTER — Telehealth (HOSPITAL_BASED_OUTPATIENT_CLINIC_OR_DEPARTMENT_OTHER): Payer: Self-pay | Admitting: Obstetrics & Gynecology

## 2023-04-12 DIAGNOSIS — F1090 Alcohol use, unspecified, uncomplicated: Secondary | ICD-10-CM

## 2023-04-12 NOTE — Telephone Encounter (Signed)
PC to pt. She had two positive pregnancy tests on Saturday but four negatives since then. She has just had spotting in the past few months. Besides being unsure if she is pregnant, she is still drinking daily but tells me she has cut back to four beers per day. She did not follow up with behavioral health but would be agreeable to do so. Referral placed. Will contact an ob/gyn colleague to discuss how/when she can get seen and I will update her as soon as I hear. Her day off is Wednesday.

## 2023-04-13 ENCOUNTER — Telehealth (HOSPITAL_BASED_OUTPATIENT_CLINIC_OR_DEPARTMENT_OTHER): Payer: Self-pay | Admitting: Obstetrics & Gynecology

## 2023-04-13 DIAGNOSIS — N939 Abnormal uterine and vaginal bleeding, unspecified: Secondary | ICD-10-CM

## 2023-04-13 NOTE — Telephone Encounter (Signed)
PC to pt after I spoke with ob/gyn colleagues. She had two positive urine HCGs this past weekend, followed by four negative tests. She reports having a "chemical" pregnancy in the past. She is not on contraception. History hep C but most recent test was negative as per GI. She does not want an IUD but might take a pill. She needs to have a liver US ordered by GI. Risks, benefits, side effects, and alternatives reviewed and if she gets her pelvic and liver US and they are both normal, I will Rx micronor (progesterone only pill). I will recheck her LFTs in about 3 months if she starts it. Pt appreciative. She understands she needs to call and schedule the ultrasounds now.

## 2023-08-24 ENCOUNTER — Ambulatory Visit (HOSPITAL_BASED_OUTPATIENT_CLINIC_OR_DEPARTMENT_OTHER): Payer: Self-pay | Admitting: Obstetrics & Gynecology

## 2023-09-01 ENCOUNTER — Ambulatory Visit (INDEPENDENT_AMBULATORY_CARE_PROVIDER_SITE_OTHER): Payer: 59 | Admitting: Physician Assistant

## 2023-09-06 ENCOUNTER — Encounter (HOSPITAL_COMMUNITY): Payer: Self-pay

## 2023-09-06 ENCOUNTER — Emergency Department: Admission: EM | Admit: 2023-09-06 | Discharge: 2023-09-06 | Disposition: A | Discharge status: Home / Self Care

## 2023-09-06 ENCOUNTER — Other Ambulatory Visit: Payer: Self-pay

## 2023-09-06 DIAGNOSIS — Z5329 Procedure and treatment not carried out because of patient's decision for other reasons: Secondary | ICD-10-CM | POA: Insufficient documentation

## 2023-09-06 NOTE — Incoming ED Transfer Note (Signed)
 L sided central blurred vision that began at 0900 and was seen by optometrist. Was also endorsing bilateral hand tingling. Concerned for spiking BPs    Intermittent CP for the past few weeks, but was endorsing acute Chest Pain at [facility]    166/98 BP    Denies Cardiac Hx, no blood thinners   Given 324mg  ASA     EKG showed NSR, coming POV declined EMS

## 2023-11-26 ENCOUNTER — Ambulatory Visit: Payer: Self-pay | Attending: Dermatology | Admitting: Dermatology

## 2023-11-26 ENCOUNTER — Other Ambulatory Visit: Payer: Self-pay

## 2023-11-26 VITALS — Temp 97.9°F | Ht 64.02 in | Wt 132.1 lb

## 2023-11-26 DIAGNOSIS — L719 Rosacea, unspecified: Secondary | ICD-10-CM | POA: Insufficient documentation

## 2023-11-26 DIAGNOSIS — L811 Chloasma: Secondary | ICD-10-CM | POA: Insufficient documentation

## 2023-11-26 DIAGNOSIS — L814 Other melanin hyperpigmentation: Secondary | ICD-10-CM

## 2023-11-26 MED ORDER — MINOCYCLINE 100 MG CAPSULE
100.0000 mg | ORAL_CAPSULE | Freq: Two times a day (BID) | ORAL | 2 refills | Status: AC
Start: 2023-11-26 — End: ?

## 2023-11-26 MED ORDER — METRONIDAZOLE 0.75 % TOPICAL CREAM
TOPICAL_CREAM | Freq: Two times a day (BID) | CUTANEOUS | 11 refills | Status: AC
Start: 2023-11-26 — End: ?

## 2023-11-26 NOTE — Progress Notes (Signed)
 Dermatology Clinic, [facility]  [address]  [place] [address]  [id number]    Date:   11/26/2023  Name: Chelsea Mccall  Age: 48 y.o.      Chief complaint: Rosacea        HPI  Patient is a 48 y.o. female who presents for a skin check.  Has redness on the cheeks.  Also has brown pigmentation on the cheeks and face and is interested in treatment.  Has not tried anything in the past.  Uses clinique moisturizer with SPF.  Denies any new, changing, painful or bleeding skin lesions.  No personal history of skin cancer.          ROS     Current Medications  Outpatient Encounter Medications as of 11/26/2023   Medication Sig Dispense Refill    buprenorphine -naloxone (SUBOXONE) 8-2 mg Sublingual Tablet, Sublingual Place 8 Tablets under the tongue Daily      Metronidazole  (METROCREAM ) 0.75 % Cream Apply topically Twice daily 45 g 11    minocycline (MINOCIN) 100 mg Oral Capsule Take 1 Capsule (100 mg total) by mouth Every 12 hours 60 Capsule 2     No facility-administered encounter medications on file as of 11/26/2023.        Allergies   Allergen Reactions    Naproxen Rash     "Was given with an antibiotic when had wisdom teeth taken out, and doctors are unsure which I am allergic to"    Other      ? ABX WHEN MIXED WITH NAPROSYN        Past Medical History:   Diagnosis Date    Abnormal Pap smear of cervix 2019    HSIL    Alcoholism (CMS HCC) 11/10/2022    current issues with alcoholism    Hepatitis C     reports blood transfusion in her mother who also had hep C    Kidney stone     Migraine             Physical Exam  Vitals:    Temp:  [36.6 C (97.9 F)] 36.6 C (97.9 F) (05/23 1046)   Vitals:    11/26/23 1046   Temp: 36.6 C (97.9 F)   TempSrc: Thermal Scan   Weight: 59.9 kg (132 lb 0.9 oz)   Height: 1.626 m (5' 4.02")        Physical Exam  HENT:      Head:                Assessment and Plan  Problem List Items Addressed This Visit    None        Rosacea - start metrocream  0.75%  cream BID and minocycline 100 mg BID.  Recommend OTC moisturizer with SPF such as Everett Hitt, Elta MD, etc.  Discussed pulsed dye laser in the future if desired.  Teratogenicity reviewed.      Melasma/solar lentigines - start skin medicinals cream (hydroquinone 8% and tretinoin 0.025% cream) nightly.  Risk of hyperpigmentation reviewed.  Recommend OTC sunscreen with SPF of 30 or greater     RTC in 3 months, sooner as needed     Caralyn Chandler, MD

## 2023-12-31 ENCOUNTER — Encounter (HOSPITAL_BASED_OUTPATIENT_CLINIC_OR_DEPARTMENT_OTHER): Payer: Self-pay | Admitting: Obstetrics & Gynecology

## 2023-12-31 ENCOUNTER — Other Ambulatory Visit (HOSPITAL_BASED_OUTPATIENT_CLINIC_OR_DEPARTMENT_OTHER): Payer: Self-pay | Admitting: Obstetrics & Gynecology

## 2023-12-31 DIAGNOSIS — Z1231 Encounter for screening mammogram for malignant neoplasm of breast: Secondary | ICD-10-CM

## 2024-01-25 ENCOUNTER — Other Ambulatory Visit: Payer: Self-pay

## 2024-01-25 ENCOUNTER — Encounter (HOSPITAL_BASED_OUTPATIENT_CLINIC_OR_DEPARTMENT_OTHER): Payer: Self-pay | Admitting: Obstetrics & Gynecology

## 2024-01-25 ENCOUNTER — Ambulatory Visit: Payer: Self-pay | Attending: Obstetrics & Gynecology | Admitting: Obstetrics & Gynecology

## 2024-01-25 ENCOUNTER — Ambulatory Visit (HOSPITAL_BASED_OUTPATIENT_CLINIC_OR_DEPARTMENT_OTHER)
Admission: RE | Admit: 2024-01-25 | Discharge: 2024-01-25 | Disposition: A | Discharge status: Home / Self Care | Payer: Self-pay | Source: Ambulatory Visit | Attending: Obstetrics & Gynecology | Admitting: Obstetrics & Gynecology

## 2024-01-25 VITALS — BP 136/87 | HR 86 | Temp 97.9°F | Resp 16 | Ht 64.0 in | Wt 133.8 lb

## 2024-01-25 DIAGNOSIS — F1020 Alcohol dependence, uncomplicated: Secondary | ICD-10-CM | POA: Insufficient documentation

## 2024-01-25 DIAGNOSIS — Z01419 Encounter for gynecological examination (general) (routine) without abnormal findings: Secondary | ICD-10-CM

## 2024-01-25 DIAGNOSIS — F1090 Alcohol use, unspecified, uncomplicated: Secondary | ICD-10-CM

## 2024-01-25 DIAGNOSIS — Z1231 Encounter for screening mammogram for malignant neoplasm of breast: Secondary | ICD-10-CM | POA: Insufficient documentation

## 2024-01-25 DIAGNOSIS — B1920 Unspecified viral hepatitis C without hepatic coma: Secondary | ICD-10-CM | POA: Insufficient documentation

## 2024-01-25 DIAGNOSIS — F17210 Nicotine dependence, cigarettes, uncomplicated: Secondary | ICD-10-CM | POA: Insufficient documentation

## 2024-01-25 DIAGNOSIS — Z01411 Encounter for gynecological examination (general) (routine) with abnormal findings: Secondary | ICD-10-CM | POA: Insufficient documentation

## 2024-01-25 DIAGNOSIS — Z803 Family history of malignant neoplasm of breast: Secondary | ICD-10-CM | POA: Insufficient documentation

## 2024-01-25 NOTE — Cancer Center Note (Signed)
  [facility]  Department of Gynecology Oncology  Well Woman Exam      NOTE AUTHORED BY: Silvano Bruckner, APRN, FNP-BC    Date: 01/25/2024  Name: Chelsea Mccall  MRN: [id number]  Referring Physician: Self, Referral  Primary Care Provider: No Pcp    This is a 48 y.o. G12P0060 female for annual exam. She has no gyn complaints. She needs her mammogram order for next year--Wednesday work best since she's off.     Her periods are light and about every month or so. She is not contracepted. She denies the need for STI testing. She thinks she is getting varicose veins.    She told me previously she is an alcoholic and drinks at least 6 beers per day and has done so since she had an 18 week IUFD to a baby girl. She smokes 1 ppd. She does not attend AA and has not been to counseling or rehab in recent times.     Obstetrics History:  OB History       Gravida   6    Para   0    Term   0    Preterm   0    AB   6    Living   0         SAB   2    IAB   3    Ectopic        Multiple        Live Births   0                 Past Medical History:  All were reviewed and updated in the EMR.  Past Surgical History:   Procedure Laterality Date    HX APPENDECTOMY      HX CHOLECYSTECTOMY      HX WISDOM TEETH EXTRACTION            Past Medical History:   Diagnosis Date    Abnormal Pap smear of cervix 2019    HSIL    Alcoholism (CMS HCC) 11/10/2022    current issues with alcoholism    Hepatitis C     reports blood transfusion in her mother who also had hep C    Kidney stone     Migraine          Current Outpatient Medications   Medication Sig    buprenorphine -naloxone (SUBOXONE) 8-2 mg Sublingual Tablet, Sublingual Place 8 Tablets under the tongue Daily (Patient taking differently: Place 8 Tablets under the tongue Daily Only uses prn)    Metronidazole  (METROCREAM ) 0.75 % Cream Apply topically Twice daily (Patient not taking: Reported on 01/25/2024)    minocycline  (MINOCIN ) 100 mg Oral Capsule Take 1 Capsule (100 mg total) by mouth  Every 12 hours (Patient not taking: Reported on 01/25/2024)     Naproxen and Other    Ob/Gyn Hx:  Gyn History       LMP: 01/11/2024, Having periods    Age at Menarche: 45    Age at First Pregnancy: 14    Age at Menopause:     Gyn History Comments:     Sexual Activity: Yes; Female    Contraception: None            History of abnormal paps? Yes, vulvar HPV 2004, HPV+ pap 2019   Last paps and results:   09/03/2015 NILM, HPV neg  11/03/2022 NILM HPV neg, BV    Pain with menses? no  H/o ovarian cysts, uterine fibroids, endometrial polyps? no  Sexually active? yes   Issues with sex drive, dyspareunia or orgasm? no    Last mammo with results: 01/25/2024  H/O Breast Biopsy:  no    Social History:  Social History     Occupational History    Not on file    [employer] [place] [id number]    Family History:  Family Medical History:       Problem Relation (Age of Onset)    Blood Clots Mother    Breast Cancer Maternal Grandmother, Paternal Grandmother    Other cancer Father    Prostate Cancer Paternal Grandfather    Thyroid  Cancer Paternal Grandmother          ROS: Other than ROS in the HPI, all other systems were negative.  Physical Examination:  Most Recent Vitals    Flowsheet Row Office Visit from 01/25/2024 in Gynecologic Oncology, [facility]   Temperature 36.6 C (97.9 F) filed at... 01/25/2024 0742   Heart Rate 86 filed at... 01/25/2024 0742   Respiratory Rate 16 filed at... 01/25/2024 0742   BP (Non-Invasive) 136/87 filed at... 01/25/2024 0742   SpO2 100 % filed at... 01/25/2024 0742   Height 1.626 m (5' 4) filed at... 01/25/2024 0742   Weight 60.7 kg (133 lb 13.1 oz) filed at... 01/25/2024 0742   BMI (Calculated) 23.02 filed at... 01/25/2024 0742   BSA (Calculated) 1.66 filed at... 01/25/2024 0742      Body mass index is 22.97 kg/m.    Assisted by: Charmaine Officer, LPN, obs by Maurilio Spearman, PA-S  Constitutional: Well-nourished, well-developed, NAD, looks like stated age  Habitus: Normal  Skin: No rash or  lesions, warm to touch, normal turgor  Head: Normocephalic, atraumatic  Eyes: EOMI; Conjunctiva appear normal; pupils equal and reactive  Neck/Thyroid : Supple, no masses or thyromegaly, no JVD  Breasts: Symmetrical, no dominant masses; nipple d/c, skin changes, or LAD; examined erect and supine  Lungs: Clear to auscultation bilaterally, normal excursion and effort  Cardiac: Regular rate and rhythm, without murmur, rub, or gallop  Abdomen: Soft, NT, ND, no masses  Lymphatic: No supraclavicular or axillary lymphadenopathy  Extremities: No cyanosis, clubbing, or edema.  Neurologic: Normal gait, balance, and gross motor function, no focal signs  Psychiatric: Mood and affect normal    Gynecologic:  External Genitalia: NL architecture, no lesions, erythema, or skin changes  Urethra: WNL, no diverticuli noted  Bladder: NT on vaginal exam  Hiatus: normal caliber  POPQ Stage: No pelvic organ prolapse  Vagina: NT, normal rugae, caliber, and support; no lesions or discharge  Cervix: NT, no polyps or lesions  Uterus: NT, mobile, NSSC  Adnexa: NT, no masses palpable    Assessment/Plan:   1. Encounter for well woman exam with abnormal findings (Primary)  - exam unremarkable  - pap deferred and reviewed guidelines    2. Alcohol use disorder  - encouraged patient to consider AA or attend [facility] and she will consider    3. Hepatitis C  - saw GI lately  - I called and scheduled her liver US  for her today    4. Screening mammogram for breast cancer  - MAMMO BILATERAL SCREENING-ADDL VIEWS/BREAST US  AS REQ BY RAD; Future    RTC one year, sooner prn.     On the day of the encounter, a total of 22 minutes was spent on this patient encounter including review of historical information, examination, documentation and post-visit activities.  The time documented excludes procedural time.    The patient was seen independently.     Addilyne Backs K Grogg, APRN,FNP-BC

## 2024-02-06 DIAGNOSIS — Z1231 Encounter for screening mammogram for malignant neoplasm of breast: Secondary | ICD-10-CM

## 2024-02-07 ENCOUNTER — Other Ambulatory Visit (HOSPITAL_BASED_OUTPATIENT_CLINIC_OR_DEPARTMENT_OTHER): Payer: Self-pay | Admitting: Obstetrics & Gynecology

## 2024-02-07 DIAGNOSIS — R928 Other abnormal and inconclusive findings on diagnostic imaging of breast: Secondary | ICD-10-CM

## 2024-02-08 ENCOUNTER — Other Ambulatory Visit: Payer: Self-pay

## 2024-02-08 ENCOUNTER — Ambulatory Visit
Admission: RE | Admit: 2024-02-08 | Discharge: 2024-02-08 | Disposition: A | Discharge status: Home / Self Care | Source: Ambulatory Visit | Attending: Student in an Organized Health Care Education/Training Program | Admitting: Student in an Organized Health Care Education/Training Program

## 2024-02-08 DIAGNOSIS — B1920 Unspecified viral hepatitis C without hepatic coma: Secondary | ICD-10-CM | POA: Insufficient documentation

## 2024-02-08 DIAGNOSIS — F1090 Alcohol use, unspecified, uncomplicated: Secondary | ICD-10-CM | POA: Insufficient documentation

## 2024-02-09 ENCOUNTER — Ambulatory Visit (INDEPENDENT_AMBULATORY_CARE_PROVIDER_SITE_OTHER): Payer: Self-pay | Admitting: Student in an Organized Health Care Education/Training Program

## 2024-02-16 NOTE — Telephone Encounter (Signed)
 Called patient to schedule a follow up virtual  visit. Patient scheduled for telephone appointment on Thursday 02/24/2024 at 11am. Patient voiced understanding. Rosina Siah Kannan , RN

## 2024-02-24 ENCOUNTER — Ambulatory Visit
Attending: Student in an Organized Health Care Education/Training Program | Admitting: Student in an Organized Health Care Education/Training Program

## 2024-02-24 ENCOUNTER — Other Ambulatory Visit: Payer: Self-pay

## 2024-02-24 ENCOUNTER — Encounter (INDEPENDENT_AMBULATORY_CARE_PROVIDER_SITE_OTHER): Payer: Self-pay | Admitting: Student in an Organized Health Care Education/Training Program

## 2024-02-24 DIAGNOSIS — F1090 Alcohol use, unspecified, uncomplicated: Secondary | ICD-10-CM

## 2024-02-24 DIAGNOSIS — F1010 Alcohol abuse, uncomplicated: Secondary | ICD-10-CM

## 2024-02-24 DIAGNOSIS — F17210 Nicotine dependence, cigarettes, uncomplicated: Secondary | ICD-10-CM

## 2024-02-24 DIAGNOSIS — Z716 Tobacco abuse counseling: Secondary | ICD-10-CM

## 2024-02-24 DIAGNOSIS — Z1211 Encounter for screening for malignant neoplasm of colon: Secondary | ICD-10-CM

## 2024-02-24 DIAGNOSIS — Z8619 Personal history of other infectious and parasitic diseases: Secondary | ICD-10-CM

## 2024-02-24 MED ORDER — SODIUM,POTASSIUM,MAG SULFATES 17.5 GRAM-3.13 GRAM-1.6 GRAM ORAL SOLN
1.0000 | Freq: Two times a day (BID) | ORAL | 0 refills | Status: AC
Start: 2024-02-24 — End: ?

## 2024-02-24 NOTE — Progress Notes (Signed)
 GASTROENTEROLOGY/HEPATOLOGY, PHYSICIAN OFFICE CENTER  [address]  [place] [address]  Operated by [facility]  Telephone Visit    Name:  Chelsea Mccall MRN: [id number]   Date:  02/24/2024 DOB: [birth year] (48 y.o.)          The patient/family initiated a request for telephone service.  Verbal consent for this service was obtained from the patient/family.  Reason for audio only:Patient cannot use or does not consent to using video technology    Last office visit in this department: 01/26/2023      Reason for call: follow up   Call notes:  Pleasant 48 year old lady with a history of alcohol use disorder, history of hepatitis-C here for follow-up.  Patient has a remote history of IV drug use, on Suboxone, history of hepatitis-C treated with Mavyret with undetectable PCR.   Currently reports drinking around 6 beers daily, denies any hard liquor.  She is a current smoker but is trying to cut back.  Denies any jaundice, nausea or vomiting, increased abdominal girth, melena or hematochezia, confusion or unintentional weight loss.    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   Family history significant for father with metastatic head and neck cancer, family history of colon cancer in her grandmother.    A/P:  Pleasant 43 year old lady with remote history of IV drug use, history of hep C treated with Mavyret, alcohol use disorder here for follow-up.    # HCV, treated with Mavyret  Undetectable PCR a year ago  # AUD  I again discussed natural history of alcohol-related liver disease, advised patient to cut back and ideally stop alcohol.     I personally reviewed the ultrasound elastography done on 02/08/2024, the liver appeared normal, no evidence of steatosis, no focal hepatic lesions, no biliary ductal dilation.  Elastography with median value of 5.92 KPA which corresponds to mild to fibrosis.    Liver enzymes normal, blood tests from 11/03/2022, bilirubin 0.6,  AST 22, ALT 15 alkaline phosphatase 68.  Will recheck liver enzymes  Also recommend healthy lifestyle, weight loss and exercise  Patient is immune to hep A, needs hepatitis-B vaccine  Also counseled on smoking cessation    # CRC screening  Will plan for a colonoscopy.   Benefits and risks of the procedure were discussed with the patient and include, but not limited to, the risk of bleeding, infection, perforation, reaction to anesthesia, phlebitis, need for emergency blood products or surgery, injury to other organs, and missed lesions. Also discussed were alternative evaluation options, like imaging, etc, as well as the option of not proceeding. The patient had the opportunity to ask all questions and they were answered to the patient's satisfaction. The patient understands and agrees to proceed.         ICD-10-CM    1. Alcohol use disorder  F10.90 HEPATIC FUNCTION PANEL      2. History of hepatitis C  Z86.19       3. Colon cancer screening  Z12.11           LOS Determination: Medical Decision Making- Direct audio communication with patient was 15 minutes    Chelsea Annelle Lindsay, MD

## 2024-03-07 ENCOUNTER — Ambulatory Visit (HOSPITAL_BASED_OUTPATIENT_CLINIC_OR_DEPARTMENT_OTHER): Payer: Self-pay | Admitting: Dermatology

## 2024-03-15 ENCOUNTER — Ambulatory Visit (HOSPITAL_BASED_OUTPATIENT_CLINIC_OR_DEPARTMENT_OTHER): Payer: Self-pay

## 2024-04-05 ENCOUNTER — Ambulatory Visit (HOSPITAL_COMMUNITY): Payer: Self-pay

## 2024-04-06 ENCOUNTER — Encounter (HOSPITAL_BASED_OUTPATIENT_CLINIC_OR_DEPARTMENT_OTHER): Payer: Self-pay | Admitting: Obstetrics & Gynecology

## 2024-04-06 ENCOUNTER — Other Ambulatory Visit (HOSPITAL_BASED_OUTPATIENT_CLINIC_OR_DEPARTMENT_OTHER): Payer: Self-pay | Admitting: Obstetrics & Gynecology

## 2024-04-06 ENCOUNTER — Other Ambulatory Visit: Payer: Self-pay

## 2024-04-06 ENCOUNTER — Ambulatory Visit (HOSPITAL_COMMUNITY)
Admission: RE | Admit: 2024-04-06 | Discharge: 2024-04-06 | Disposition: A | Discharge status: Home / Self Care | Payer: Self-pay | Source: Ambulatory Visit

## 2024-04-06 ENCOUNTER — Ambulatory Visit
Admission: RE | Admit: 2024-04-06 | Discharge: 2024-04-06 | Disposition: A | Discharge status: Home / Self Care | Payer: Self-pay | Source: Ambulatory Visit | Attending: Obstetrics & Gynecology | Admitting: Obstetrics & Gynecology

## 2024-04-06 DIAGNOSIS — R928 Other abnormal and inconclusive findings on diagnostic imaging of breast: Secondary | ICD-10-CM

## 2024-04-06 DIAGNOSIS — Z1231 Encounter for screening mammogram for malignant neoplasm of breast: Secondary | ICD-10-CM

## 2024-07-24 ENCOUNTER — Encounter (HOSPITAL_COMMUNITY): Payer: Self-pay

## 2024-09-01 ENCOUNTER — Ambulatory Visit (INDEPENDENT_AMBULATORY_CARE_PROVIDER_SITE_OTHER): Payer: Self-pay | Admitting: Student in an Organized Health Care Education/Training Program

## 2025-02-02 ENCOUNTER — Ambulatory Visit (HOSPITAL_BASED_OUTPATIENT_CLINIC_OR_DEPARTMENT_OTHER): Admitting: Obstetrics & Gynecology

## 2025-02-07 ENCOUNTER — Ambulatory Visit (HOSPITAL_BASED_OUTPATIENT_CLINIC_OR_DEPARTMENT_OTHER): Payer: Self-pay | Admitting: Obstetrics & Gynecology

## 2025-02-12 ENCOUNTER — Encounter (HOSPITAL_COMMUNITY): Payer: Self-pay

## 2025-02-12 ENCOUNTER — Ambulatory Visit: Admit: 2025-02-12 | Payer: Self-pay | Admitting: GASTROENTEROLOGY

## 2025-02-12 SURGERY — COLONOSCOPY
Anesthesia: Monitor Anesthesia Care | Site: Anus
